# Patient Record
Sex: Female | Born: 1990 | Race: Black or African American | Hispanic: No | Marital: Single | Smoking: Former smoker
Health system: Southern US, Community
[De-identification: ages and names within clinical notes are randomized; demographics above are authoritative.]

## PROBLEM LIST (undated history)

## (undated) DIAGNOSIS — N926 Irregular menstruation, unspecified: Secondary | ICD-10-CM

## (undated) DIAGNOSIS — R7303 Prediabetes: Secondary | ICD-10-CM

## (undated) HISTORY — PX: NO PAST SURGERIES: SHX2092

## (undated) HISTORY — DX: Prediabetes: R73.03

## (undated) HISTORY — DX: Irregular menstruation, unspecified: N92.6

---
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
## 2009-03-23 ENCOUNTER — Emergency Department (HOSPITAL_COMMUNITY): Admission: EM | Admit: 2009-03-23 | Discharge: 2009-03-23 | Payer: Self-pay | Admitting: Emergency Medicine

## 2009-05-15 ENCOUNTER — Emergency Department (HOSPITAL_COMMUNITY): Admission: EM | Admit: 2009-05-15 | Discharge: 2009-05-15 | Payer: Self-pay | Admitting: Family Medicine

## 2009-07-16 ENCOUNTER — Emergency Department (HOSPITAL_COMMUNITY): Admission: EM | Admit: 2009-07-16 | Discharge: 2009-07-16 | Payer: Self-pay | Admitting: Emergency Medicine

## 2010-05-06 ENCOUNTER — Emergency Department (HOSPITAL_COMMUNITY): Admission: EM | Admit: 2010-05-06 | Discharge: 2010-05-06 | Payer: Self-pay | Admitting: Emergency Medicine

## 2010-05-24 ENCOUNTER — Emergency Department (HOSPITAL_COMMUNITY): Admission: EM | Admit: 2010-05-24 | Discharge: 2010-05-24 | Payer: Self-pay | Admitting: Emergency Medicine

## 2010-12-13 LAB — URINALYSIS, ROUTINE W REFLEX MICROSCOPIC
Nitrite: NEGATIVE
Protein, ur: NEGATIVE mg/dL
Urobilinogen, UA: 0.2 mg/dL (ref 0.0–1.0)

## 2010-12-13 LAB — POCT URINALYSIS DIPSTICK
Nitrite: NEGATIVE
Protein, ur: NEGATIVE mg/dL
Urobilinogen, UA: 0.2 mg/dL (ref 0.0–1.0)

## 2010-12-13 LAB — POCT PREGNANCY, URINE

## 2011-01-02 LAB — POCT URINALYSIS DIP (DEVICE)
Protein, ur: NEGATIVE mg/dL
Specific Gravity, Urine: 1.02 (ref 1.005–1.030)
Urobilinogen, UA: 0.2 mg/dL (ref 0.0–1.0)

## 2011-01-02 LAB — POCT PREGNANCY, URINE

## 2011-01-04 LAB — POCT URINALYSIS DIP (DEVICE)
Protein, ur: NEGATIVE mg/dL
Specific Gravity, Urine: 1.015 (ref 1.005–1.030)
Urobilinogen, UA: 0.2 mg/dL (ref 0.0–1.0)

## 2011-01-04 LAB — POCT PREGNANCY, URINE

## 2011-01-06 LAB — POCT URINALYSIS DIP (DEVICE)
Protein, ur: NEGATIVE mg/dL
Specific Gravity, Urine: 1.015 (ref 1.005–1.030)
Urobilinogen, UA: 0.2 mg/dL (ref 0.0–1.0)

## 2011-01-06 LAB — POCT I-STAT, CHEM 8
BUN: 5 mg/dL — ABNORMAL LOW (ref 6–23)
Calcium, Ion: 1.19 mmol/L (ref 1.12–1.32)
Creatinine, Ser: 0.8 mg/dL (ref 0.4–1.2)
Glucose, Bld: 124 mg/dL — ABNORMAL HIGH (ref 70–99)
TCO2: 29 mmol/L (ref 0–100)

## 2011-01-06 LAB — POCT PREGNANCY, URINE

## 2011-01-14 LAB — WET PREP, GENITAL: Clue Cells Wet Prep HPF POC: NONE SEEN

## 2011-01-14 LAB — POCT URINALYSIS DIP (DEVICE)
Protein, ur: 30 mg/dL — AB
Specific Gravity, Urine: 1.015 (ref 1.005–1.030)
Urobilinogen, UA: 0.2 mg/dL (ref 0.0–1.0)
pH: 8.5 — ABNORMAL HIGH (ref 5.0–8.0)

## 2011-04-02 ENCOUNTER — Emergency Department (HOSPITAL_COMMUNITY)
Admission: EM | Admit: 2011-04-02 | Discharge: 2011-04-02 | Disposition: A | Discharge status: Home / Self Care | Payer: Medicaid Other | Attending: Emergency Medicine | Admitting: Emergency Medicine

## 2011-04-02 DIAGNOSIS — B9689 Other specified bacterial agents as the cause of diseases classified elsewhere: Secondary | ICD-10-CM | POA: Insufficient documentation

## 2011-04-02 DIAGNOSIS — N72 Inflammatory disease of cervix uteri: Secondary | ICD-10-CM | POA: Insufficient documentation

## 2011-04-02 DIAGNOSIS — A499 Bacterial infection, unspecified: Secondary | ICD-10-CM | POA: Insufficient documentation

## 2011-04-02 DIAGNOSIS — N760 Acute vaginitis: Secondary | ICD-10-CM | POA: Insufficient documentation

## 2011-04-02 LAB — WET PREP, GENITAL
Trich, Wet Prep: NONE SEEN
Yeast Wet Prep HPF POC: NONE SEEN

## 2011-04-02 LAB — URINALYSIS, ROUTINE W REFLEX MICROSCOPIC
Glucose, UA: NEGATIVE mg/dL
Hgb urine dipstick: NEGATIVE
Ketones, ur: NEGATIVE mg/dL
Protein, ur: NEGATIVE mg/dL

## 2011-04-03 LAB — GC/CHLAMYDIA PROBE AMP, GENITAL: GC Probe Amp, Genital: NEGATIVE

## 2011-06-19 LAB — URINALYSIS, ROUTINE W REFLEX MICROSCOPIC
Glucose, UA: NEGATIVE
Nitrite: NEGATIVE
Protein, ur: NEGATIVE
pH: 6.5

## 2011-06-19 LAB — PREGNANCY, URINE

## 2011-06-23 LAB — WET PREP, GENITAL: Yeast Wet Prep HPF POC: NONE SEEN

## 2011-06-23 LAB — GC/CHLAMYDIA PROBE AMP, GENITAL
Chlamydia, DNA Probe: NEGATIVE
GC Probe Amp, Genital: NEGATIVE

## 2011-06-23 LAB — POCT URINALYSIS DIP (DEVICE)
Nitrite: NEGATIVE
Operator id: 247071
Protein, ur: 30 — AB

## 2011-09-18 ENCOUNTER — Encounter: Payer: Self-pay | Admitting: *Deleted

## 2011-09-18 ENCOUNTER — Emergency Department (INDEPENDENT_AMBULATORY_CARE_PROVIDER_SITE_OTHER)
Admission: EM | Admit: 2011-09-18 | Discharge: 2011-09-18 | Disposition: A | Discharge status: Home / Self Care | Payer: Medicaid Other | Source: Home / Self Care | Attending: Family Medicine | Admitting: Family Medicine

## 2011-09-18 DIAGNOSIS — T304 Corrosion of unspecified body region, unspecified degree: Secondary | ICD-10-CM

## 2011-09-18 DIAGNOSIS — T300 Burn of unspecified body region, unspecified degree: Secondary | ICD-10-CM

## 2011-09-18 DIAGNOSIS — L84 Corns and callosities: Secondary | ICD-10-CM

## 2011-09-18 DIAGNOSIS — IMO0002 Reserved for concepts with insufficient information to code with codable children: Secondary | ICD-10-CM

## 2011-09-18 MED ORDER — SILVER SULFADIAZINE 1 % EX CREA: TOPICAL_CREAM | Freq: Every day | CUTANEOUS | Status: DC

## 2011-09-18 NOTE — ED Notes (Signed)
Pt is here with multiple dermatologic complaints.  Has round wound to left underside of forearm.  Reports it started out looking like ringworm so she poured bleach on it.  Also reports "bump" in right axilla and abrasions to bilat bottoms of feet from manicure.

## 2011-09-18 NOTE — ED Provider Notes (Signed)
History     CSN: [id number]  Arrival date & time 09/18/11  0907   First MD Initiated Contact with Patient 09/18/11 1018      Chief Complaint  Patient presents with  . Wound Check    (Consider location/radiation/quality/duration/timing/severity/associated sxs/prior treatment) HPI Comments: 20 y/o obese female no significant PMH here with vague complaints:  1) Skin lesion: Has a tender dark brown spot in left fore arm over a tattooed area. Pt noticed a red itchi discoloration in the center a star tattoo and diagnosed herself with a ring worm proceeding to apply non disolved bleach in it, area now is dark brown and tender. Tatoos are not recent. 2)knot left underarm: has had a boil there in the past months ago, that spontaneously drained. Now has a "hard knot" no discharge also present for months. No breast lumps, tenderness or skin changes. 3) Foot soles are hard and tender. Has has sister trying to do a pedicure on her shoes soles are very thinned are falling apart, almost walking on her soaks, asked if we can help stapling her shoes. States she walks long distances to get to her home.   History reviewed. No pertinent past medical history.  History reviewed. No pertinent past surgical history.  History reviewed. No pertinent family history.  History  Substance Use Topics  . Smoking status: Never Smoker   . Smokeless tobacco: Not on file  . Alcohol Use: No    OB History    Grav Para Term Preterm Abortions TAB SAB Ect Mult Living                  Review of Systems  All other systems reviewed and are negative.    Allergies  Review of patient's allergies indicates no known allergies.  Home Medications   Current Outpatient Rx  Name Route Sig Dispense Refill  . SILVER SULFADIAZINE 1 % EX CREA Topical Apply topically daily. 50 g 0    BP 120/84  Pulse 70  Temp(Src) 98.3 F (36.8 C) (Oral)  Resp 18  SpO2 99%  LMP 08/24/2011  Physical Exam  Nursing note and  vitals reviewed. Constitutional: She is oriented to person, place, and time. She appears well-developed and well-nourished. No distress.       Talking on her cell phone sitting in exam table.  Neck: Neck supple.  Cardiovascular: Normal heart sounds.   Pulmonary/Chest: Breath sounds normal.  Lymphadenopathy:    She has no cervical adenopathy.  Neurological: She is alert and oriented to person, place, and time.  Skin:       Superficial, small epidermal cyst about 1x1 cm in left axilla, fibrotic consistency, no fluctuation, no attached to deep planes. No erythema, tenderness or drainage. No axillary adenopathies. Left forearm: about 2x3 cm flat brown dry abrasion mild base swelling no erythema around. No fluctuation, induration or drainage. Bilateral soles: Generalized thickness of the skin more over pressure areas in heel, lateral and metatarsal areas. Skin is callus like: thick, hypopigmented and peels. No skin brakes, ulcers or drainage.     ED Course  Procedures (including critical care time)  Labs Reviewed - No data to display No results found.   1. Chemical burn   2. Foot callus       MDM  Declined postop shoes. Needs to change shoes and get ones with soft padding, encouraged to loose weight and was referred to foot center. Hand out about foot callus and corn home care provided.  Sharin Grave, MD 09/19/11 1234

## 2011-10-03 ENCOUNTER — Ambulatory Visit: Payer: Medicaid Other | Admitting: *Deleted

## 2012-02-27 ENCOUNTER — Emergency Department (HOSPITAL_COMMUNITY)
Admission: EM | Admit: 2012-02-27 | Discharge: 2012-02-27 | Disposition: A | Discharge status: Home / Self Care | Payer: Medicaid Other | Source: Home / Self Care | Attending: Emergency Medicine | Admitting: Emergency Medicine

## 2012-02-27 ENCOUNTER — Encounter (HOSPITAL_COMMUNITY): Payer: Self-pay | Admitting: *Deleted

## 2012-02-27 DIAGNOSIS — N926 Irregular menstruation, unspecified: Secondary | ICD-10-CM

## 2012-02-27 LAB — POCT URINALYSIS DIP (DEVICE)
Ketones, ur: NEGATIVE mg/dL
Leukocytes, UA: NEGATIVE
Protein, ur: NEGATIVE mg/dL
Urobilinogen, UA: 0.2 mg/dL (ref 0.0–1.0)
pH: 6.5 (ref 5.0–8.0)

## 2012-02-27 NOTE — ED Provider Notes (Signed)
History     CSN: [id number]  Arrival date & time 02/27/12  0930   First MD Initiated Contact with Patient 02/27/12 0945      Chief Complaint  Patient presents with  . Fatigue    (Consider location/radiation/quality/duration/timing/severity/associated sxs/prior treatment) HPI Comments: Patient presents urgent care requesting to be tested for pregnancy and she's been feeling more tired recently she's been gaining weight. She describes her menstrual cycles as being very regular but was expecting her menstrual period this last week. Have not had her period yet. She has not tested herself at home.  The history is provided by the patient.    History reviewed. No pertinent past medical history.  History reviewed. No pertinent past surgical history.  Family History  Problem Relation Age of Onset  . Sickle cell anemia Sister     History  Substance Use Topics  . Smoking status: Never Smoker   . Smokeless tobacco: Not on file  . Alcohol Use: No    OB History    Grav Para Term Preterm Abortions TAB SAB Ect Mult Living                  Review of Systems  Constitutional: Positive for appetite change. Negative for fever, diaphoresis, activity change, fatigue and unexpected weight change.  Gastrointestinal: Negative for nausea and abdominal pain.  Genitourinary: Negative for urgency and pelvic pain.    Allergies  Review of patient's allergies indicates no known allergies.  Home Medications   Current Outpatient Rx  Name Route Sig Dispense Refill  . SILVER SULFADIAZINE 1 % EX CREA Topical Apply topically daily. 50 g 0    BP 128/82  Pulse 65  Temp(Src) 98.5 F (36.9 C) (Oral)  Resp 16  SpO2 96%  LMP 12/11/2011  Physical Exam  Nursing note and vitals reviewed. Constitutional: She is oriented to person, place, and time. Vital signs are normal. She appears well-developed and well-nourished.  Non-toxic appearance. She does not have a sickly appearance. She does not appear  ill. No distress.  HENT:  Head: Normocephalic.  Eyes: Conjunctivae are normal.  Abdominal: She exhibits no distension.  Neurological: She is alert and oriented to person, place, and time.  Psychiatric: She has a normal mood and affect.    ED Course  Procedures (including critical care time)   Labs Reviewed  POCT URINALYSIS DIP (DEVICE)  POCT PREGNANCY, URINE   No results found.   1. Irregular menstruation    negative pregnancy test    MDM  Patient reporting irregular periods for years, she has a gynecologist at [facility] that she has not called or have been seen for this reason. Pregnancy test was negative. Encourage patient to call her gynecologist as to further discuss her irregular menstruations .     Jimmie Molly, MD 02/27/12 1341

## 2012-02-27 NOTE — ED Notes (Signed)
Pt is asking for a pregnancy test.   She also gained about 20-30 lbs in the past 3-4 months and c/o frequent fatigue

## 2012-02-27 NOTE — Discharge Instructions (Signed)
As discussed followup with your gynecologist to discuss this irregular periods further. Will apply 2 to repeat your pregnancy test in 7-10 days from now. With an over-the-counter product   Abnormal Uterine Bleeding Abnormal uterine bleeding can have many causes. Some cases are simply treated, while others are more serious. There are several kinds of bleeding that is considered abnormal, including:  Bleeding between periods.   Bleeding after sexual intercourse.   Spotting anytime in the menstrual cycle.   Bleeding heavier or more than normal.   Bleeding after menopause.  CAUSES  There are many causes of abnormal uterine bleeding. It can be present in teenagers, pregnant women, women during their reproductive years, and women who have reached menopause. Your caregiver will look for the more common causes depending on your age, signs, symptoms and your particular circumstance. Most cases are not serious and can be treated. Even the more serious causes, like cancer of the female organs, can be treated adequately if found in the early stages. That is why all types of bleeding should be evaluated and treated as soon as possible. DIAGNOSIS  Diagnosing the cause may take several kinds of tests. Your caregiver may:  Take a complete history of the type of bleeding.   Perform a complete physical exam and Pap smear.   Take an ultrasound on the abdomen showing a picture of the female organs and the pelvis.   Inject dye into the uterus and Fallopian tubes and X-ray them (hysterosalpingogram).   Place fluid in the uterus and do an ultrasound (sonohysterogrqphy).   Take a CT scan to examine the female organs and pelvis.   Take an MRI to examine the female organs and pelvis. There is no X-ray involved with this procedure.   Look inside the uterus with a telescope that has a light at the end (hysteroscopy).   Scrap the inside of the uterus to get tissue to examine (Dilatation and Curettage, D&C).    Look into the pelvis with a telescope that has a light at the end (laparoscopy). This is done through a very small cut (incision) in the abdomen.  TREATMENT  Treatment will depend on the cause of the abnormal bleeding. It can include:  Doing nothing to allow the problem to take care of itself over time.   Hormone treatment.   Birth control pills.   Treating the medical condition causing the problem.   Laparoscopy.   Major or minor surgery   Destroying the lining of the uterus with electrical currant, laser, freezing or heat (uterine ablation).  HOME CARE INSTRUCTIONS   Follow your caregiver's recommendation on how to treat your problem.   See your caregiver if you missed a menstrual period and think you may be pregnant.   If you are bleeding heavily, count the number of pads/tampons you use and how often you have to change them. Tell this to your caregiver.   Avoid sexual intercourse until the problem is controlled.  SEEK MEDICAL CARE IF:   You have any kind of abnormal bleeding mentioned above.   You feel dizzy at times.   You are 21 years old and have not had a menstrual period yet.  SEEK IMMEDIATE MEDICAL CARE IF:   You pass out.   You are changing pads/tampons every 15 to 30 minutes.   You have belly (abdominal) pain.   You have a temperature of 100 F (37.8 C) or higher.   You become sweaty or weak.   You are passing large  blood clots from the vagina.   You start to feel sick to your stomach (nauseous) and throw up (vomit).  Document Released: 09/15/2005 Document Revised: 09/04/2011 Document Reviewed: 02/08/2009 [facility] Patient Information 2012 [place], [place].

## 2012-08-15 ENCOUNTER — Encounter (HOSPITAL_COMMUNITY): Payer: Self-pay | Admitting: *Deleted

## 2012-08-15 ENCOUNTER — Emergency Department (HOSPITAL_COMMUNITY): Payer: Medicaid Other

## 2012-08-15 ENCOUNTER — Emergency Department (HOSPITAL_COMMUNITY)
Admission: EM | Admit: 2012-08-15 | Discharge: 2012-08-15 | Disposition: A | Discharge status: Home / Self Care | Payer: Medicaid Other | Attending: Emergency Medicine | Admitting: Emergency Medicine

## 2012-08-15 DIAGNOSIS — I10 Essential (primary) hypertension: Secondary | ICD-10-CM | POA: Insufficient documentation

## 2012-08-15 DIAGNOSIS — R109 Unspecified abdominal pain: Secondary | ICD-10-CM | POA: Insufficient documentation

## 2012-08-15 DIAGNOSIS — M778 Other enthesopathies, not elsewhere classified: Secondary | ICD-10-CM

## 2012-08-15 DIAGNOSIS — M65839 Other synovitis and tenosynovitis, unspecified forearm: Secondary | ICD-10-CM | POA: Insufficient documentation

## 2012-08-15 LAB — CBC WITH DIFFERENTIAL/PLATELET
Basophils Absolute: 0 K/uL (ref 0.0–0.1)
Basophils Relative: 0 % (ref 0–1)
Eosinophils Absolute: 0.3 K/uL (ref 0.0–0.7)
Eosinophils Relative: 2 % (ref 0–5)
HCT: 42.2 % (ref 36.0–46.0)
Hemoglobin: 14.1 g/dL (ref 12.0–15.0)
Lymphocytes Relative: 32 % (ref 12–46)
Lymphs Abs: 4.3 10*3/uL — ABNORMAL HIGH (ref 0.7–4.0)
MCH: 26.4 pg (ref 26.0–34.0)
MCHC: 33.4 g/dL (ref 30.0–36.0)
MCV: 79 fL (ref 78.0–100.0)
Monocytes Absolute: 0.7 10*3/uL (ref 0.1–1.0)
Monocytes Relative: 5 % (ref 3–12)
Neutro Abs: 8.1 K/uL — ABNORMAL HIGH (ref 1.7–7.7)
Neutrophils Relative %: 60 % (ref 43–77)
Platelets: 233 K/uL (ref 150–400)
RBC: 5.34 MIL/uL — ABNORMAL HIGH (ref 3.87–5.11)
RDW: 14 % (ref 11.5–15.5)
WBC: 13.5 10*3/uL — ABNORMAL HIGH (ref 4.0–10.5)

## 2012-08-15 LAB — URINALYSIS, MICROSCOPIC ONLY
Bilirubin Urine: NEGATIVE
Glucose, UA: NEGATIVE mg/dL
Hgb urine dipstick: NEGATIVE
Ketones, ur: NEGATIVE mg/dL
Leukocytes, UA: NEGATIVE
Nitrite: NEGATIVE
Protein, ur: NEGATIVE mg/dL
Specific Gravity, Urine: 1.025 (ref 1.005–1.030)
Urobilinogen, UA: 0.2 mg/dL (ref 0.0–1.0)
pH: 6.5 (ref 5.0–8.0)

## 2012-08-15 LAB — COMPREHENSIVE METABOLIC PANEL WITH GFR
AST: 20 U/L (ref 0–37)
Albumin: 4.1 g/dL (ref 3.5–5.2)
Alkaline Phosphatase: 101 U/L (ref 39–117)
Chloride: 98 meq/L (ref 96–112)
Potassium: 4.2 meq/L (ref 3.5–5.1)
Sodium: 138 meq/L (ref 135–145)
Total Bilirubin: 0.1 mg/dL — ABNORMAL LOW (ref 0.3–1.2)
Total Protein: 8.1 g/dL (ref 6.0–8.3)

## 2012-08-15 LAB — WET PREP, GENITAL: Clue Cells Wet Prep HPF POC: NONE SEEN

## 2012-08-15 LAB — COMPREHENSIVE METABOLIC PANEL
ALT: 16 U/L (ref 0–35)
BUN: 11 mg/dL (ref 6–23)
CO2: 29 mEq/L (ref 19–32)
Calcium: 10 mg/dL (ref 8.4–10.5)
Creatinine, Ser: 0.69 mg/dL (ref 0.50–1.10)
GFR calc Af Amer: >90 mL/min (ref >90)
GFR calc non Af Amer: >90 mL/min (ref >90)
Glucose, Bld: 110 mg/dL — ABNORMAL HIGH (ref 70–99)

## 2012-08-15 LAB — POCT PREGNANCY, URINE: Preg Test, Ur: NEGATIVE

## 2012-08-15 LAB — LIPASE, BLOOD: Lipase: 23 U/L (ref 11–59)

## 2012-08-15 MED ORDER — DICYCLOMINE HCL 20 MG PO TABS: 20.0000 mg | ORAL_TABLET | Freq: Two times a day (BID) | ORAL | Status: DC

## 2012-08-15 NOTE — ED Provider Notes (Signed)
History     CSN: [id number]  Arrival date & time 08/15/12  1253   First MD Initiated Contact with Patient 08/15/12 1522      Chief Complaint  Patient presents with  . Abdominal Pain  . Hypertension  . Arm Pain    (Consider location/radiation/quality/duration/timing/severity/associated sxs/prior treatment) HPI Sherry Duke is a 21 y.o. female who presents with complaint of abdominal pain, and left forearm pain.  Abdominal pain:  Pt states pain started about 3 years ago. States on and off but most recently constant. Pain is in lower abdomen. [facility] had [facility], [place], pelvic exam and was told it was "weak stomach." Pt states she is unsure what that means, and pain not improving. Pt denies fever, chills, changes in appetite, malaise, urinary symptoms, vaginal discharge or bleeding. Pt not taking any medications for this. Bowels are normal. Pt does not remember her last period, states she is very irregular, and only gets about 3 periods a year.  Left forearm pain: Pt states he woke up with pain in left wrist pain this morning. Denies injury. States pain with movement of the wrist. No redness or swelling. States no hx of the same. No pain in hand or elbow.  History reviewed. No pertinent past medical history.  History reviewed. No pertinent past surgical history.  Family History  Problem Relation Age of Onset  . Sickle cell anemia Sister     History  Substance Use Topics  . Smoking status: Never Smoker   . Smokeless tobacco: Not on file  . Alcohol Use: No    OB History    Grav Para Term Preterm Abortions TAB SAB Ect Mult Living                  Review of Systems  Gastrointestinal: Positive for abdominal pain. Negative for nausea and diarrhea.  Genitourinary: Positive for pelvic pain. Negative for dysuria, vaginal bleeding and vaginal discharge.  Musculoskeletal: Positive for myalgias.  Neurological: Negative for  weakness and numbness.  All other systems reviewed and are negative.    Allergies  Review of patient's allergies indicates no known allergies.  Home Medications  No current outpatient prescriptions on file.  BP 143/88  Pulse 79  Temp 98 F (36.7 C) (Oral)  Resp 18  SpO2 97%  LMP 02/13/2012  Physical Exam  Nursing note and vitals reviewed. Constitutional: She appears well-developed and well-nourished. No distress.       obese  HENT:  Head: Normocephalic.  Eyes: Conjunctivae normal are normal.  Neck: Neck supple.  Cardiovascular: Normal rate, regular rhythm and normal heart sounds.   Pulmonary/Chest: Effort normal and breath sounds normal.  Abdominal: Soft. Bowel sounds are normal. She exhibits no distension. There is tenderness. There is no rebound and no guarding.       Suprapubic tenderness  Genitourinary: Vagina normal and uterus normal. No vaginal discharge found.       No cmt  Musculoskeletal:       Left wrist normal appearing. Tender to palpation over entire left wrist and distal forearm. Pain with left wrist ROM in any directions. Normal hand exam. Normal elbow. There is no swelling of hand or wrist. Normal distal radial pulse. No redness or warmth to the touch with palpation of the tender area  Neurological: She is alert.  Skin: Skin is warm and dry.  Psychiatric: She has a normal mood and affect.    ED Course  Procedures (including critical care time)  PT with lower abdominal pain for several years now, worsening. Left forearm pain, labs, x-ray pending.   Results for orders placed during the hospital encounter of 08/15/12  URINALYSIS, MICROSCOPIC ONLY      Component Value Range   Color, Urine YELLOW  YELLOW   APPearance CLEAR  CLEAR   Specific Gravity, Urine 1.025  1.005 - 1.030   pH 6.5  5.0 - 8.0   Glucose, UA NEGATIVE  NEGATIVE mg/dL   Hgb urine dipstick NEGATIVE  NEGATIVE   Bilirubin Urine NEGATIVE  NEGATIVE   Ketones, ur NEGATIVE  NEGATIVE mg/dL    Protein, ur NEGATIVE  NEGATIVE mg/dL   Urobilinogen, UA 0.2  0.0 - 1.0 mg/dL   Nitrite NEGATIVE  NEGATIVE   Leukocytes, UA NEGATIVE  NEGATIVE   Squamous Epithelial / LPF RARE  RARE  CBC WITH DIFFERENTIAL      Component Value Range   WBC 13.5 (*) 4.0 - 10.5 K/uL   RBC 5.34 (*) 3.87 - 5.11 MIL/uL   Hemoglobin 14.1  12.0 - 15.0 g/dL   HCT 16.1  09.6 - 04.5 %   MCV 79.0  78.0 - 100.0 fL   MCH 26.4  26.0 - 34.0 pg   MCHC 33.4  30.0 - 36.0 g/dL   RDW 40.9  81.1 - 91.4 %   Platelets 233  150 - 400 K/uL   Neutrophils Relative 60  43 - 77 %   Neutro Abs 8.1 (*) 1.7 - 7.7 K/uL   Lymphocytes Relative 32  12 - 46 %   Lymphs Abs 4.3 (*) 0.7 - 4.0 K/uL   Monocytes Relative 5  3 - 12 %   Monocytes Absolute 0.7  0.1 - 1.0 K/uL   Eosinophils Relative 2  0 - 5 %   Eosinophils Absolute 0.3  0.0 - 0.7 K/uL   Basophils Relative 0  0 - 1 %   Basophils Absolute 0.0  0.0 - 0.1 K/uL  COMPREHENSIVE METABOLIC PANEL      Component Value Range   Sodium 138  135 - 145 mEq/L   Potassium 4.2  3.5 - 5.1 mEq/L   Chloride 98  96 - 112 mEq/L   CO2 29  19 - 32 mEq/L   Glucose, Bld 110 (*) 70 - 99 mg/dL   BUN 11  6 - 23 mg/dL   Creatinine, Ser 7.82  0.50 - 1.10 mg/dL   Calcium 95.6  8.4 - 21.3 mg/dL   Total Protein 8.1  6.0 - 8.3 g/dL   Albumin 4.1  3.5 - 5.2 g/dL   AST 20  0 - 37 U/L   ALT 16  0 - 35 U/L   Alkaline Phosphatase 101  39 - 117 U/L   Total Bilirubin 0.1 (*) 0.3 - 1.2 mg/dL   GFR calc non Af Amer >90  >90 mL/min   GFR calc Af Amer >90  >90 mL/min  LIPASE, BLOOD      Component Value Range   Lipase 23  11 - 59 U/L  POCT PREGNANCY, URINE      Component Value Range   Preg Test, Ur NEGATIVE  NEGATIVE  WET PREP, GENITAL      Component Value Range   Yeast Wet Prep HPF POC NONE SEEN  NONE SEEN   Trich, Wet Prep NONE SEEN  NONE SEEN   Clue Cells Wet Prep HPF POC NONE SEEN  NONE SEEN   WBC, Wet Prep HPF POC  RARE (*) NONE SEEN   Dg Forearm Left  08/15/2012  *RADIOLOGY REPORT*  Clinical Data:  Left forearm pain.  No injury.  LEFT FOREARM - 2 VIEW  Comparison: None.  Findings: Left radius and ulna appear normal.  Soft tissues are normal.  No radiopaque foreign body.  IMPRESSION: Negative.   Original Report Authenticated By: Andreas Newport, M.D.       1. Abdominal pain   2. Tendonitis of wrist, left       MDM  Pt with lower abdominal pain for 3 years. She is non toxic appearing. ua and pelvic exam unremarkable. [facility] that [facility]. She has no n/v/d. No fever, suspect IBS? Pt instructed to follow up with pcp. Will try bentyl. Left forearm x-ray negative. Suspect tendonitis. Splint. Follow up. No signs of infection the forearm.         Lottie Mussel, PA 08/16/12 0009

## 2012-08-15 NOTE — ED Notes (Signed)
Back from xray

## 2012-08-15 NOTE — Progress Notes (Signed)
Orthopedic Tech Progress Note Patient Details:  Sherry Duke October 03, 1990 [id number]  Ortho Devices Type of Ortho Device: Velcro wrist splint Ortho Device/Splint Location: left UE Ortho Device/Splint Interventions: Application   Yuvan Medinger T 08/15/2012, 6:49 PM

## 2012-08-15 NOTE — ED Notes (Signed)
Patient discharged using teach back method she verbalizes an understanding.Left arm splinted per ortho, NAD noted at the time of discharge.

## 2012-08-15 NOTE — ED Notes (Signed)
Patient complains of severe arm pain she denies injury.

## 2012-08-15 NOTE — ED Notes (Signed)
Pt has multiple complaints. Reports chronic abd pain, now radiates down into her pelvic area. Having chronic left arm pain and also reports HTN. bp 143/88 at triage.

## 2012-08-15 NOTE — ED Notes (Signed)
Patient transported to X-ray 

## 2012-08-15 NOTE — ED Notes (Signed)
Placed in a gown for pelvic exam

## 2012-08-16 LAB — GC/CHLAMYDIA PROBE AMP: CT Probe RNA: NEGATIVE

## 2012-08-17 NOTE — ED Provider Notes (Signed)
Medical screening examination/treatment/procedure(s) were performed by non-physician practitioner and as supervising physician I was immediately available for consultation/collaboration.   Kelsee Preslar Y. Maddock Finigan, MD 08/17/12 2128 

## 2012-08-31 ENCOUNTER — Emergency Department (INDEPENDENT_AMBULATORY_CARE_PROVIDER_SITE_OTHER)
Admission: EM | Admit: 2012-08-31 | Discharge: 2012-08-31 | Disposition: A | Discharge status: Home / Self Care | Payer: Medicaid Other | Source: Home / Self Care

## 2012-08-31 ENCOUNTER — Encounter (HOSPITAL_COMMUNITY): Payer: Self-pay | Admitting: Emergency Medicine

## 2012-08-31 DIAGNOSIS — L84 Corns and callosities: Secondary | ICD-10-CM

## 2012-08-31 DIAGNOSIS — R109 Unspecified abdominal pain: Secondary | ICD-10-CM

## 2012-08-31 DIAGNOSIS — G8929 Other chronic pain: Secondary | ICD-10-CM

## 2012-08-31 MED ORDER — DICYCLOMINE HCL 20 MG PO TABS: 20.0000 mg | ORAL_TABLET | Freq: Two times a day (BID) | ORAL | Status: DC

## 2012-08-31 NOTE — ED Notes (Addendum)
Reports pelvic pain and foot pain

## 2012-08-31 NOTE — ED Provider Notes (Signed)
History     CSN: [id number]  Arrival date & time 08/31/12  1559      Chief Complaint  Patient presents with  . Pelvic Pain  . Foot Pain     HPI 21 year old female here for medication refill of dicyclomine. She has chronic pain in her lower abdomen and pelvis ongoing for past 3-4 years. She has had evaluation by GYN in the past including ultrasound of her upper abdomen 2 months ago which she claims to have been normal. She informs of having off and on crampy abdominal pain associated with some loose bowel movements. Informs that the symptoms occur a few times a week as well as wakes her up from sleep on few occasions with the urge to defecate. She denies any nausea or vomiting. Her menstrual cycle is irregular. She denies any change in her weight or appetite. Denies any depressive symptoms. She denies any fever, chills, headache, blurry vision, hoarseness of voice, chest pain, palpitations, shortness of breath, urinary symptoms or constipation. She denies any muscle aches or joint pain.  History reviewed. No pertinent past medical history.  History reviewed. No pertinent past surgical history.  Family History  Problem Relation Age of Onset  . Sickle cell anemia Sister     History  Substance Use Topics  . Smoking status: , smokes marijuana on a daily basis   . Smokeless tobacco: Not on file  . Alcohol Use: No    OB History    Grav Para Term Preterm Abortions TAB SAB Ect Mult Living                  Review of Systems As outlined in history of present illness Denies headache, blurry vision, fever, chills, hoarseness of voice Denies cough, shortness of breath, chest pain, palpitations, dyspnea on exertion Lower Abdominal pain which is chronic. Denies dysuria urgency or frequency. Abdominal pain is associated with loose bowel movements. Denies joint pain or myalgia. Has chronic pain over bilateral foot with callus. Denies depressive symptoms. Denies changes in mood, changes in  weight or appetite.  Allergies  Review of patient's allergies indicates no known allergies.  Home Medications   Current Outpatient Rx  Name  Route  Sig  Dispense  Refill  . DICYCLOMINE HCL 20 MG PO TABS   Oral   Take 1 tablet (20 mg total) by mouth 2 (two) times daily.   30 tablet   0     BP 126/85  Pulse 82  Temp 98.1 F (36.7 C) (Oral)  Resp 20  SpO2 99%  LMP 02/13/2012  Physical Exam  Young obese female in no acute distress HEENT: No pallor, no icterus, moist oral mucosa Chest: Clear to auscultation bilaterally spine CVS: Normal S1 and S2 no murmurs Abdomen: Soft, bowel sounds present nontender, nondistended Extremities warm, callus in bilateral feet which is tender to exam CNS: AAO x3 ED Course  Procedures (including critical care time)  Labs Reviewed - No data to display No results found.  Assessment /plan 1. Chronic abdominal pain   2. Callus of foot    Chronic abdominal pain Patient has chronic abdominal pain and pelvic pain that has been ongoing for past 3-4 years. She informs having had a GYN workup including pelvic ultrasound which was unremarkable. She does have menstrual irregularities. She denies having constipation however had episodes of loose bowel movements. She denies any change in her weight or appetite. Denies any mood changes. She however does inform of having to get up  in the night off and on with urge for defecation. Patient was recently seen in the ED and prescribed dicyclomine which has help with her symptoms to an extent. -I. will renew her prescription off dicyclomine. She needs a GI evaluation for ongoing symptoms and I will make a referral for that.  Callus off bilateral feet These have been ongoing for few years already. She was advised on postop shoes which she declined in the past. He has been advised on wearing soft cushioned shoes. I will make a referral to podiatry for evaluation.   ? Prediabetes Patient informs being diagnosed  of having prediabetes and elevated cholesterol. I will check an A1c level and lipid panel. She will followup with [place] in 3 months. I have counseled her on diet controlled and regular exercise to reduce weight. Blood Work done recently in the ED was unremarkable .   Health maintenance Patient informs to followup with GYN for Pap smear.  MDM          Eddie North, MD 08/31/12 [phone]

## 2012-09-01 LAB — HEMOGLOBIN A1C: Hgb A1c MFr Bld: 6.4 % — ABNORMAL HIGH (ref <5.7)

## 2012-09-01 LAB — LIPID PANEL
LDL Cholesterol: 131 mg/dL — ABNORMAL HIGH (ref 0–99)
VLDL: 25 mg/dL (ref 0–40)

## 2012-09-01 LAB — TSH: TSH: 1.899 u[IU]/mL (ref 0.350–4.500)

## 2012-10-14 NOTE — ED Notes (Signed)
Referral to podiatry- [facility] patient had an appt 09/10/12 @ 9:30 am did not show- referral to [facility] for abd pain 09/20/12 @ 3pm pt did not show to her appt

## 2012-11-12 ENCOUNTER — Emergency Department (INDEPENDENT_AMBULATORY_CARE_PROVIDER_SITE_OTHER)
Admission: EM | Admit: 2012-11-12 | Discharge: 2012-11-12 | Disposition: A | Discharge status: Home / Self Care | Payer: Medicaid Other | Source: Home / Self Care

## 2012-11-12 ENCOUNTER — Encounter (HOSPITAL_COMMUNITY): Payer: Self-pay

## 2012-11-12 DIAGNOSIS — Z7251 High risk heterosexual behavior: Secondary | ICD-10-CM

## 2012-11-12 DIAGNOSIS — R7309 Other abnormal glucose: Secondary | ICD-10-CM

## 2012-11-12 DIAGNOSIS — R7303 Prediabetes: Secondary | ICD-10-CM

## 2012-11-12 LAB — POCT PREGNANCY, URINE: Preg Test, Ur: NEGATIVE

## 2012-11-12 LAB — HIV ANTIBODY (ROUTINE TESTING W REFLEX): HIV: NONREACTIVE

## 2012-11-12 MED ORDER — METFORMIN HCL 500 MG PO TABS: ORAL_TABLET | ORAL | Status: DC

## 2012-11-12 NOTE — ED Provider Notes (Signed)
History     CSN: [id number]  Arrival date & time 11/12/12  1231   First MD Initiated Contact with Patient 11/12/12 1247      Chief Complaint  Patient presents with  . Follow-up    (Consider location/radiation/quality/duration/timing/severity/associated sxs/prior treatment) HPI Patient is here today for a routine checkup. She tells me that she has a new boyfriend and she has been having unprotected sex with him. She wants herself to be checked out for HIV. Patient also wants a urine pregnancy test done today. No other complaints at this time.  History reviewed. No pertinent past medical history.  History reviewed. No pertinent past surgical history.  Family History  Problem Relation Age of Onset  . Sickle cell anemia Sister     History  Substance Use Topics  . Smoking status: Never Smoker   . Smokeless tobacco: Not on file  . Alcohol Use: No    OB History   Grav Para Term Preterm Abortions TAB SAB Ect Mult Living                  Review of Systems  Allergies  Review of patient's allergies indicates no known allergies.  Home Medications   Current Outpatient Rx  Name  Route  Sig  Dispense  Refill  . dicyclomine (BENTYL) 20 MG tablet   Oral   Take 1 tablet (20 mg total) by mouth 2 (two) times daily.   30 tablet   0   . metFORMIN (GLUCOPHAGE) 500 MG tablet      Please take 1 tab with supper for 1 week and then 1 tab twice daily with meals.   60 tablet   2     BP 123/75  Pulse 64  Temp(Src) 98.4 F (36.9 C) (Oral)  SpO2 95%  Physical Exam Physical Exam: General: Vital signs reviewed and noted. Well-developed, well-nourished, in no acute distress; alert, appropriate and cooperative throughout examination.  Head: Normocephalic, atraumatic.  Eyes: PERRL, EOMI, No signs of anemia or jaundince.  Nose: Mucous membranes moist, not inflammed, nonerythematous.  Throat: Oropharynx nonerythematous, no exudate appreciated.   Neck: No deformities, masses,  or tenderness noted.Supple, No carotid Bruits, no JVD.  Lungs:  Normal respiratory effort. Clear to auscultation BL without crackles or wheezes.  Heart: RRR. S1 and S2 normal without gallop, murmur, or rubs.  Abdomen:  BS normoactive. Soft, Nondistended, non-tender.  No masses or organomegaly.  Extremities: No pretibial edema.  Neurologic: A&O X3, CN II - XII are grossly intact. Motor strength is 5/5 in the all 4 extremities, Sensations intact to light touch, Cerebellar signs negative.  Skin: No visible rashes, scars.    ED Course  Procedures (including critical care time)  Labs Reviewed  HIV ANTIBODY (ROUTINE TESTING)   No results found.   1. Prediabetes   2. High risk sexual behavior       MDM  1. patient's urine pregnancy test was done and is negative at this time. 2. her HIV test was withdrawn and she will be informed about the results. 3. no other blood tests need to be done for normal physical at this time. 4. the patient was counseled for high risk sexual behavior and was told to use protection to be safe from sexually transmitted diseases. 5. patient is currently prediabetic based on her HbA1c of 6.4. I have started her on metformin 500 mg twice a day. Patient needs to followup in 3 months. Patient was also advised to lose weight which should  help with her prediabetes.      Lars Mage, MD 11/12/12 1319

## 2012-11-12 NOTE — ED Notes (Signed)
Patient states would like a check up Would like bloodwork as well

## 2013-02-11 ENCOUNTER — Emergency Department (HOSPITAL_COMMUNITY): Payer: Medicaid Other

## 2013-02-11 ENCOUNTER — Emergency Department (HOSPITAL_COMMUNITY)
Admission: EM | Admit: 2013-02-11 | Discharge: 2013-02-11 | Disposition: A | Discharge status: Home / Self Care | Payer: Medicaid Other | Attending: Emergency Medicine | Admitting: Emergency Medicine

## 2013-02-11 ENCOUNTER — Encounter (HOSPITAL_COMMUNITY): Payer: Self-pay | Admitting: Cardiology

## 2013-02-11 DIAGNOSIS — N898 Other specified noninflammatory disorders of vagina: Secondary | ICD-10-CM | POA: Insufficient documentation

## 2013-02-11 DIAGNOSIS — M79609 Pain in unspecified limb: Secondary | ICD-10-CM | POA: Insufficient documentation

## 2013-02-11 DIAGNOSIS — E669 Obesity, unspecified: Secondary | ICD-10-CM | POA: Insufficient documentation

## 2013-02-11 DIAGNOSIS — M79622 Pain in left upper arm: Secondary | ICD-10-CM

## 2013-02-11 DIAGNOSIS — R7309 Other abnormal glucose: Secondary | ICD-10-CM | POA: Insufficient documentation

## 2013-02-11 DIAGNOSIS — Z79899 Other long term (current) drug therapy: Secondary | ICD-10-CM | POA: Insufficient documentation

## 2013-02-11 DIAGNOSIS — R0789 Other chest pain: Secondary | ICD-10-CM | POA: Insufficient documentation

## 2013-02-11 DIAGNOSIS — Z3202 Encounter for pregnancy test, result negative: Secondary | ICD-10-CM | POA: Insufficient documentation

## 2013-02-11 DIAGNOSIS — N939 Abnormal uterine and vaginal bleeding, unspecified: Secondary | ICD-10-CM

## 2013-02-11 DIAGNOSIS — Z8742 Personal history of other diseases of the female genital tract: Secondary | ICD-10-CM | POA: Insufficient documentation

## 2013-02-11 LAB — URINALYSIS, ROUTINE W REFLEX MICROSCOPIC
Bilirubin Urine: NEGATIVE
Ketones, ur: NEGATIVE mg/dL
Nitrite: NEGATIVE
Protein, ur: NEGATIVE mg/dL
Urobilinogen, UA: 1 mg/dL (ref 0.0–1.0)
pH: 6 (ref 5.0–8.0)

## 2013-02-11 LAB — WET PREP, GENITAL: Yeast Wet Prep HPF POC: NONE SEEN

## 2013-02-11 LAB — POCT I-STAT, CHEM 8
Calcium, Ion: 1.23 mmol/L (ref 1.12–1.23)
Chloride: 104 mEq/L (ref 96–112)
HCT: 41 % (ref 36.0–46.0)
Sodium: 142 mEq/L (ref 135–145)

## 2013-02-11 LAB — POCT I-STAT TROPONIN I: Troponin i, poc: 0 ng/mL (ref 0.00–0.08)

## 2013-02-11 LAB — POCT PREGNANCY, URINE: Preg Test, Ur: NEGATIVE

## 2013-02-11 NOTE — ED Notes (Signed)
Provider at the bedside.  

## 2013-02-11 NOTE — ED Notes (Signed)
Pt shown to the bathroom and asked for a urine sample

## 2013-02-11 NOTE — ED Provider Notes (Signed)
History     CSN: [id number]  Arrival date & time 02/11/13  4098   First MD Initiated Contact with Patient 02/11/13 0802      No chief complaint on file.   (Consider location/radiation/quality/duration/timing/severity/associated sxs/prior treatment) HPI  Sherry Duke is a 22 year old female presents emergency department with complaint of vaginal bleeding and chest pain. She is obese and borderline diabetic with Hgb A1C of 6.4 in February of this year. Patient C. She has a history of irregular menstruation and generally does not get her period, however she has been bleeding for the past 4 weeks.  She states that there is an occasional resolution of her bleeding for a day or so and then she will begin to bleed from her vagina again.  She states that she is going through approximately 3 pads a day with her bleeding.  Patient has frequency of urination without dysuria, hematuria, or urgency.  She denies discharge or foul odor from her vagina.  She denies pain with sexual intercourse.  Patient is sexually active, she does not use any form of birth control. Patient denies any abdominal pain, constipation, diarrhea.  She has mild daily nausea but denies any vomiting. Patient also complains of chest pain.  She states that this has been going on for approximately 2 weeks.  She states that the pain is under her left breast.  She describes it as squeezing and painful.  It occurs intermittently and is not associated with movement or exertion.  She states that it lasts anywhere from 1-10 seconds.  She denies any chest pressure or, diaphoresis, shortness of breath.  Patient has a family history of heart disease but is unsure if anyone in her family has had a heart attack.  She does have a strong family history of obesity and diabetes.  She is nonsmoker.  She denies any unilateral leg swelling or pain.  She denies any hemoptysis or dyspnea.  No recent surgeries or confinement no exogenous estrogens.     No  past medical history on file.  No past surgical history on file.  Family History  Problem Relation Age of Onset  . Sickle cell anemia Sister     History  Substance Use Topics  . Smoking status: Never Smoker   . Smokeless tobacco: Not on file  . Alcohol Use: No    OB History   Grav Para Term Preterm Abortions TAB SAB Ect Mult Living                  Review of Systems Ten systems reviewed and are negative for acute change, except as noted in the HPI.   Allergies  Review of patient's allergies indicates no known allergies.  Home Medications   Current Outpatient Rx  Name  Route  Sig  Dispense  Refill  . dicyclomine (BENTYL) 20 MG tablet   Oral   Take 1 tablet (20 mg total) by mouth 2 (two) times daily.   30 tablet   0   . metFORMIN (GLUCOPHAGE) 500 MG tablet      Please take 1 tab with supper for 1 week and then 1 tab twice daily with meals.   60 tablet   2     There were no vitals taken for this visit.  Physical Exam  Nursing note and vitals reviewed. Constitutional: She is oriented to person, place, and time. She appears well-developed and well-nourished. No distress.  Obese  HENT:  Head: Normocephalic and atraumatic.  Eyes: Conjunctivae are  normal. No scleral icterus.  Neck: Normal range of motion.  Cardiovascular: Normal rate, regular rhythm and normal heart sounds.  Exam reveals no gallop and no friction rub.   No murmur heard. Pulmonary/Chest: Effort normal and breath sounds normal. No respiratory distress.  Abdominal: Soft. Bowel sounds are normal. She exhibits no distension and no mass. There is no tenderness. There is no guarding.  Neurological: She is alert and oriented to person, place, and time.  Skin: Skin is warm and dry. She is not diaphoretic.    ED Course  Procedures (including critical care time)  Labs Reviewed - No data to display No results found.   Date: 02/11/2013  Rate: 79  Rhythm: normal sinus rhythm  QRS Axis: normal   Intervals: normal  ST/T Wave abnormalities: nonspecific T wave changes  Conduction Disutrbances:none  Narrative Interpretation:   Old EKG Reviewed: none available    1. Vaginal bleeding   2. Axillary pain, left   3. Atypical chest pain       MDM  9:04 AM Patient with out chest pain or bleeding at this time.  I suspect the patients, urinary frequency may be to her blood sugars.  Patient I will evaluate the patient's hgb level, urine, urine preg,     9:57 AM BP 147/86  Pulse 68  Temp(Src) 97.8 F (36.6 C) (Oral)  Resp 18  SpO2 98%  LMP 10/01/2012 Patient c/o paiful lumps under her left armpit. States that they have been there for a long time but have recently become painful. On exam, she has painful, firm superficial lumps in the crease of her axilla. On my exam with [place] I did not see drainable any dark fluid filled pockets or drainable abscess. Likely sebaceous cyst, may be developing into infection.  pateint al;so states that she has not been taking her metformin as prescribed due to the facet that medicaid does not cover the entire cost. She also states that she "does not care" anymore, but denies depression, SI/AVS/HI.  Pelvic exam: normal external genitalia, vulva, vagina, cervix, minimal mucoid dischacge with some bloody streaking. Bimanual exam limited due to body habitus,  exam chaperoned by NT JIll.   10:49 AM Filed Vitals:   02/11/13 1006  BP: 131/63  Pulse: 61  Temp:   Resp: 16   Patient with slightly elevated blood glucose of 101.  Her genital wet prep shows some moderate leukorrhea without signs of other vaginal infection.  No cervicitis on exam no tenderness with bimanual exam. Hemoglobin is 13.9 and no concern for anemia. Chest x-ray normal.  No concern for acute ischemia on her EKG.  I counseled the patient on medical compliance with her metformin and as well as long-term effects of diabetes.  I will refer the patient to [facility] surgery regarding the  cysts in her axilla.  Patient is seen by for me know women[facility] and I will have her followup with them regarding her irregular menstruation.  Negative pregnancy negative urine.   Arthor Captain, PA-C 02/11/13 1101

## 2013-02-11 NOTE — ED Notes (Signed)
Pt reports that she has been on her period for an abnormal amount of time. States that she has also been having nausea but no vomiting. States that last night she felt like "her heart was squeezing" and then her left leg was numb for awhile. Denies any SOB.

## 2013-02-12 NOTE — ED Provider Notes (Signed)
Medical screening examination/treatment/procedure(s) were performed by non-physician practitioner and as supervising physician I was immediately available for consultation/collaboration.   Laray Anger, DO 02/12/13 1027

## 2013-03-21 ENCOUNTER — Encounter: Payer: Self-pay | Admitting: Obstetrics

## 2013-03-21 ENCOUNTER — Ambulatory Visit (INDEPENDENT_AMBULATORY_CARE_PROVIDER_SITE_OTHER): Payer: Medicaid Other | Admitting: Obstetrics & Gynecology

## 2013-03-21 VITALS — BP 132/89 | HR 72 | Temp 97.4°F | Ht 65.0 in | Wt 272.0 lb

## 2013-03-21 DIAGNOSIS — N926 Irregular menstruation, unspecified: Secondary | ICD-10-CM

## 2013-03-21 DIAGNOSIS — R87611 Atypical squamous cells cannot exclude high grade squamous intraepithelial lesion on cytologic smear of cervix (ASC-H): Secondary | ICD-10-CM

## 2013-03-21 DIAGNOSIS — N939 Abnormal uterine and vaginal bleeding, unspecified: Secondary | ICD-10-CM

## 2013-03-21 DIAGNOSIS — N949 Unspecified condition associated with female genital organs and menstrual cycle: Secondary | ICD-10-CM

## 2013-03-21 DIAGNOSIS — E282 Polycystic ovarian syndrome: Secondary | ICD-10-CM

## 2013-03-21 DIAGNOSIS — N925 Other specified irregular menstruation: Secondary | ICD-10-CM

## 2013-03-21 LAB — HEMOGLOBIN AND HEMATOCRIT, BLOOD: Hemoglobin: 12.7 g/dL (ref 12.0–15.0)

## 2013-03-21 NOTE — Patient Instructions (Addendum)
Etonogestrel implant What is this medicine? ETONOGESTREL is a contraceptive (birth control) device. It is used to prevent pregnancy. It can be used for up to 3 years. This medicine may be used for other purposes; ask your health care provider or pharmacist if you have questions. What should I tell my health care provider before I take this medicine? They need to know if you have any of these conditions: -abnormal vaginal bleeding -blood vessel disease or blood clots -cancer of the breast, cervix, or liver -depression -diabetes -gallbladder disease -headaches -heart disease or recent heart attack -high blood pressure -high cholesterol -kidney disease -liver disease -renal disease -seizures -tobacco smoker -an unusual or allergic reaction to etonogestrel, other hormones, anesthetics or antiseptics, medicines, foods, dyes, or preservatives -pregnant or trying to get pregnant -breast-feeding How should I use this medicine? This device is inserted just under the skin on the inner side of your upper arm by a health care professional. Talk to your pediatrician regarding the use of this medicine in children. Special care may be needed. Overdosage: If you think you've taken too much of this medicine contact a poison control center or emergency room at once. Overdosage: If you think you have taken too much of this medicine contact a poison control center or emergency room at once. NOTE: This medicine is only for you. Do not share this medicine with others. What if I miss a dose? This does not apply. What may interact with this medicine? Do not take this medicine with any of the following medications: -amprenavir -bosentan -fosamprenavir This medicine may also interact with the following medications: -barbiturate medicines for inducing sleep or treating seizures -certain medicines for fungal infections like ketoconazole and itraconazole -griseofulvin -medicines to treat seizures like  carbamazepine, felbamate, oxcarbazepine, phenytoin, topiramate -modafinil -phenylbutazone -rifampin -some medicines to treat HIV infection like atazanavir, indinavir, lopinavir, nelfinavir, tipranavir, ritonavir -St. John's wort This list may not describe all possible interactions. Give your health care provider a list of all the medicines, herbs, non-prescription drugs, or dietary supplements you use. Also tell them if you smoke, drink alcohol, or use illegal drugs. Some items may interact with your medicine. What should I watch for while using this medicine? This product does not protect you against HIV infection (AIDS) or other sexually transmitted diseases. You should be able to feel the implant by pressing your fingertips over the skin where it was inserted. Tell your doctor if you cannot feel the implant. What side effects may I notice from receiving this medicine? Side effects that you should report to your doctor or health care professional as soon as possible: -allergic reactions like skin rash, itching or hives, swelling of the face, lips, or tongue -breast lumps -changes in vision -confusion, trouble speaking or understanding -dark urine -depressed mood -general ill feeling or flu-like symptoms -light-colored stools -loss of appetite, nausea -right upper belly pain -severe headaches -severe pain, swelling, or tenderness in the abdomen -shortness of breath, chest pain, swelling in a leg -signs of pregnancy -sudden numbness or weakness of the face, arm or leg -trouble walking, dizziness, loss of balance or coordination -unusual vaginal bleeding, discharge -unusually weak or tired -yellowing of the eyes or skin Side effects that usually do not require medical attention (Report these to your doctor or health care professional if they continue or are bothersome.): -acne -breast pain -changes in weight -cough -fever or chills -headache -irregular menstrual  bleeding -itching, burning, and vaginal discharge -pain or difficulty passing urine -sore throat This   list may not describe all possible side effects. Call your doctor for medical advice about side effects. You may report side effects to FDA at 1-800-FDA-1088. Where should I keep my medicine? This drug is given in a hospital or clinic and will not be stored at home. NOTE: This sheet is a summary. It may not cover all possible information. If you have questions about this medicine, talk to your doctor, pharmacist, or health care provider.  2013, [employer]. (06/08/2009 3:54:17 PM) Levonorgestrel intrauterine device (IUD) What is this medicine? LEVONORGESTREL IUD (LEE voe nor jes trel) is a contraceptive (birth control) device. The device is placed inside the uterus by a healthcare professional. It is used to prevent pregnancy and can also be used to treat heavy bleeding that occurs during your period. Depending on the device, it can be used for 3 to 5 years. This medicine may be used for other purposes; ask your health care provider or pharmacist if you have questions. What should I tell my health care provider before I take this medicine? They need to know if you have any of these conditions: -abnormal Pap smear -cancer of the breast, uterus, or cervix -diabetes -endometritis -genital or pelvic infection now or in the past -have more than one sexual partner or your partner has more than one partner -heart disease -history of an ectopic or tubal pregnancy -immune system problems -IUD in place -liver disease or tumor -problems with blood clots or take blood-thinners -use intravenous drugs -uterus of unusual shape -vaginal bleeding that has not been explained -an unusual or allergic reaction to levonorgestrel, other hormones, silicone, or polyethylene, medicines, foods, dyes, or preservatives -pregnant or trying to get pregnant -breast-feeding How should I use this  medicine? This device is placed inside the uterus by a health care professional. Talk to your pediatrician regarding the use of this medicine in children. Special care may be needed. Overdosage: If you think you have taken too much of this medicine contact a poison control center or emergency room at once. NOTE: This medicine is only for you. Do not share this medicine with others. What if I miss a dose? This does not apply. What may interact with this medicine? Do not take this medicine with any of the following medications: -amprenavir -bosentan -fosamprenavir This medicine may also interact with the following medications: -aprepitant -barbiturate medicines for inducing sleep or treating seizures -bexarotene -griseofulvin -medicines to treat seizures like carbamazepine, ethotoin, felbamate, oxcarbazepine, phenytoin, topiramate -modafinil -pioglitazone -rifabutin -rifampin -rifapentine -some medicines to treat HIV infection like atazanavir, indinavir, lopinavir, nelfinavir, tipranavir, ritonavir -St. John's wort -warfarin This list may not describe all possible interactions. Give your health care provider a list of all the medicines, herbs, non-prescription drugs, or dietary supplements you use. Also tell them if you smoke, drink alcohol, or use illegal drugs. Some items may interact with your medicine. What should I watch for while using this medicine? Visit your doctor or health care professional for regular check ups. See your doctor if you or your partner has sexual contact with others, becomes HIV positive, or gets a sexual transmitted disease. This product does not protect you against HIV infection (AIDS) or other sexually transmitted diseases. You can check the placement of the IUD yourself by reaching up to the top of your vagina with clean fingers to feel the threads. Do not pull on the threads. It is a good habit to check placement after each menstrual period. Call your  doctor right away if you  feel more of the IUD than just the threads or if you cannot feel the threads at all. The IUD may come out by itself. You may become pregnant if the device comes out. If you notice that the IUD has come out use a backup birth control method like condoms and call your health care provider. Using tampons will not change the position of the IUD and are okay to use during your period. What side effects may I notice from receiving this medicine? Side effects that you should report to your doctor or health care professional as soon as possible: -allergic reactions like skin rash, itching or hives, swelling of the face, lips, or tongue -fever, flu-like symptoms -genital sores -high blood pressure -no menstrual period for 6 weeks during use -pain, swelling, warmth in the leg -pelvic pain or tenderness -severe or sudden headache -signs of pregnancy -stomach cramping -sudden shortness of breath -trouble with balance, talking, or walking -unusual vaginal bleeding, discharge -yellowing of the eyes or skin Side effects that usually do not require medical attention (report to your doctor or health care professional if they continue or are bothersome): -acne -breast pain -change in sex drive or performance -changes in weight -cramping, dizziness, or faintness while the device is being inserted -headache -irregular menstrual bleeding within first 3 to 6 months of use -nausea This list may not describe all possible side effects. Call your doctor for medical advice about side effects. You may report side effects to FDA at 1-800-FDA-1088. Where should I keep my medicine? This does not apply. NOTE: This sheet is a summary. It may not cover all possible information. If you have questions about this medicine, talk to your doctor, pharmacist, or health care provider.  2013, [employer]/Gold Standard. (10/16/2011 1:54:04 PM) Polycystic Ovarian Syndrome Polycystic ovarian syndrome is a  condition with a number of problems. One problem is with the ovaries. The ovaries are organs located in the female pelvis, on each side of the uterus. Usually, during the menstrual cycle, an egg is released from 1 ovary every month. This is called ovulation. When the egg is fertilized, it goes into the womb (uterus), which allows for the growth of a baby. The egg travels from the ovary through the fallopian tube to the uterus. The ovaries also make the hormones estrogen and progesterone. These hormones help the development of a woman's breasts, body shape, and body hair. They also regulate the menstrual cycle and pregnancy. Sometimes, cysts form in the ovaries. A cyst is a fluid-filled sac. On the ovary, different types of cysts can form. The most common type of ovarian cyst is called a functional or ovulation cyst. It is normal, and often forms during the normal menstrual cycle. Each month, a woman's ovaries grow tiny cysts that hold the eggs. When an egg is fully grown, the sac breaks open. This releases the egg. Then, the sac which released the egg from the ovary dissolves. In one type of functional cyst, called a follicle cyst, the sac does not break open to release the egg. It may actually continue to grow. This type of cyst usually disappears within 1 to 3 months.  One type of cyst problem with the ovaries is called Polycystic Ovarian Syndrome (PCOS). In this condition, many follicle cysts form, but do not rupture and produce an egg. This health problem can affect the following:  Menstrual cycle.  Heart.  Obesity.  Cancer of the uterus.  Fertility.  Blood vessels.  Hair growth (face and body)  or baldness.  Hormones.  Appearance.  High blood pressure.  Stroke.  Insulin production.  Inflammation of the liver.  Elevated blood cholesterol and triglycerides. CAUSES   No one knows the exact cause of PCOS.  Women with PCOS often have a mother or sister with PCOS. There is not yet  enough proof to say this is inherited.  Many women with PCOS have a weight problem.  Researchers are looking at the relationship between PCOS and the body's ability to make insulin. Insulin is a hormone that regulates the change of sugar, starches, and other food into energy for the body's use, or for storage. Some women with PCOS make too much insulin. It is possible that the ovaries react by making too many female hormones, called androgens. This can lead to acne, excessive hair growth, weight gain, and ovulation problems.  Too much production of luteinizing hormone (LH) from the pituitary gland in the brain stimulates the ovary to produce too much female hormone (androgen). SYMPTOMS   Infrequent or no menstrual periods, and/or irregular bleeding.  Inability to get pregnant (infertility), because of not ovulating.  Increased growth of hair on the face, chest, stomach, back, thumbs, thighs, or toes.  Acne, oily skin, or dandruff.  Pelvic pain.  Weight gain or obesity, usually carrying extra weight around the waist.  Type 2 diabetes (this is the diabetes that usually does not need insulin).  High cholesterol.  High blood pressure.  Female-pattern baldness or thinning hair.  Patches of thickened and dark brown or black skin on the neck, arms, breasts, or thighs.  Skin tags, or tiny excess flaps of skin, in the armpits or neck area.  Sleep apnea (excessive snoring and breathing stops at times while asleep).  Deepening of the voice.  Gestational diabetes when pregnant.  Increased risk of miscarriage with pregnancy. DIAGNOSIS  There is no single test to diagnose PCOS.   Your caregiver will:  Take a medical history.  Perform a pelvic exam.  Perform an ultrasound.  Check your female and female hormone levels.  Measure glucose or sugar levels in the blood.  Do other blood tests.  If you are producing too many female hormones, your caregiver will make sure it is from PCOS. At  the physical exam, your caregiver will want to evaluate the areas of increased hair growth. Try to allow natural hair growth for a few days before the visit.  During a pelvic exam, the ovaries may be enlarged or swollen by the increased number of small cysts. This can be seen more easily by vaginal ultrasound or screening, to examine the ovaries and lining of the uterus (endometrium) for cysts. The uterine lining may become thicker, if there has not been a regular period. TREATMENT  Because there is no cure for PCOS, it needs to be managed to prevent problems. Treatments are based on your symptoms. Treatment is also based on whether you want to have a baby or whether you need contraception.  Treatment may include:  Progesterone hormone, to start a menstrual period.  Birth control pills, to make you have regular menstrual periods.  Medicines to make you ovulate, if you want to get pregnant.  Medicines to control your insulin.  Medicine to control your blood pressure.  Medicine and diet, to control your high cholesterol and triglycerides in your blood.  Surgery, making small holes in the ovary, to decrease the amount of female hormone production. This is done through a long, lighted tube (laparoscope), placed into the  pelvis through a tiny incision in the lower abdomen. Your caregiver will go over some of the choices with you. WOMEN WITH PCOS HAVE THESE CHARACTERISTICS:  High levels of female hormones called androgens.  An irregular or no menstrual cycle.  May have many small cysts in their ovaries. PCOS is the most common hormonal reproductive problem in women of childbearing age. WHY DO WOMEN WITH PCOS HAVE TROUBLE WITH THEIR MENSTRUAL CYCLE? Each month, about 20 eggs start to mature in the ovaries. As one egg grows and matures, the follicle breaks open to release the egg, so it can travel through the fallopian tube for fertilization. When the single egg leaves the follicle, ovulation takes  place. In women with PCOS, the ovary does not make all of the hormones it needs for any of the eggs to fully mature. They may start to grow and accumulate fluid, but no one egg becomes large enough. Instead, some may remain as cysts. Since no egg matures or is released, ovulation does not occur and the hormone progesterone is not made. Without progesterone, a woman's menstrual cycle is irregular or absent. Also, the cysts produce female hormones, which continue to prevent ovulation.  Document Released: 01/09/2005 Document Revised: 12/08/2011 Document Reviewed: 08/03/2009 [facility] Patient Information 2014 [place], [place].

## 2013-03-21 NOTE — Progress Notes (Signed)
Subjective:     Sherry Duke is a 22 y.o. female here for problem visit.  Current complaints: abnormal vaginal bleeding. Pt reports 2 months ago started a light cycle that did last x 1 month.  Personal health questionnaire reviewed: not asked.   Gynecologic History Patient's last menstrual period was 03/06/2013. Contraception: abstinence Last Pap: 07/14/2011. Results were: abnormal/ ASC-H Last mammogram: n/a. Results were: n/a  Obstetric History OB History   Grav Para Term Preterm Abortions TAB SAB Ect Mult Living   0 0 0 0 0 0 0 0 0 0        The following portions of the patient's history were reviewed and updated as appropriate: allergies, current medications, past family history, past medical history, past social history, past surgical history and problem list.  Review of Systems Pertinent items are noted in HPI.    Objective:   General:  alert     Abdomen: soft, non-tender; bowel sounds normal; no masses,  no organomegaly   Vulva:  normal  Vagina: normal vagina  Cervix:  no lesions  Corpus: normal size, contour, position, consistency, mobility, non-tender  Adnexa:  normal adnexa    Assessment:    H/O ASCUS-H Pap, no f/u PCOS AUB-O  Plan:  COCP x several months Review Pap smear result Return prn

## 2013-03-23 LAB — PAP IG AND CT-NG NAA: GC Probe Amp: NEGATIVE

## 2013-05-12 ENCOUNTER — Ambulatory Visit: Payer: Medicaid Other | Admitting: Obstetrics & Gynecology

## 2013-06-22 ENCOUNTER — Ambulatory Visit: Payer: Medicaid Other | Admitting: Obstetrics & Gynecology

## 2013-11-18 ENCOUNTER — Encounter: Payer: Self-pay | Admitting: Obstetrics

## 2013-11-18 ENCOUNTER — Ambulatory Visit (INDEPENDENT_AMBULATORY_CARE_PROVIDER_SITE_OTHER): Payer: Medicaid Other | Admitting: Obstetrics

## 2013-11-18 VITALS — BP 127/84 | HR 80 | Temp 98.2°F | Ht 66.0 in | Wt 284.0 lb

## 2013-11-18 DIAGNOSIS — Z3202 Encounter for pregnancy test, result negative: Secondary | ICD-10-CM

## 2013-11-18 DIAGNOSIS — Z113 Encounter for screening for infections with a predominantly sexual mode of transmission: Secondary | ICD-10-CM

## 2013-11-18 DIAGNOSIS — Z0000 Encounter for general adult medical examination without abnormal findings: Secondary | ICD-10-CM

## 2013-11-18 DIAGNOSIS — Z3169 Encounter for other general counseling and advice on procreation: Secondary | ICD-10-CM

## 2013-11-18 DIAGNOSIS — Z124 Encounter for screening for malignant neoplasm of cervix: Secondary | ICD-10-CM

## 2013-11-18 LAB — CBC WITH DIFFERENTIAL/PLATELET
BASOS ABS: 0 10*3/uL (ref 0.0–0.1)
BASOS PCT: 0 % (ref 0–1)
Eosinophils Absolute: 0.4 10*3/uL (ref 0.0–0.7)
Eosinophils Relative: 3 % (ref 0–5)
HCT: 39.5 % (ref 36.0–46.0)
Hemoglobin: 13.6 g/dL (ref 12.0–15.0)
LYMPHS PCT: 34 % (ref 12–46)
Lymphs Abs: 4.1 10*3/uL — ABNORMAL HIGH (ref 0.7–4.0)
MCH: 26.3 pg (ref 26.0–34.0)
MCHC: 34.4 g/dL (ref 30.0–36.0)
MCV: 76.4 fL — ABNORMAL LOW (ref 78.0–100.0)
MONO ABS: 0.7 10*3/uL (ref 0.1–1.0)
Monocytes Relative: 6 % (ref 3–12)
NEUTROS ABS: 6.8 10*3/uL (ref 1.7–7.7)
NEUTROS PCT: 57 % (ref 43–77)
Platelets: 210 10*3/uL (ref 150–400)
RBC: 5.17 MIL/uL — ABNORMAL HIGH (ref 3.87–5.11)
RDW: 14.3 % (ref 11.5–15.5)
WBC: 12 10*3/uL — AB (ref 4.0–10.5)

## 2013-11-18 LAB — COMPREHENSIVE METABOLIC PANEL
ALBUMIN: 4.4 g/dL (ref 3.5–5.2)
ALK PHOS: 79 U/L (ref 39–117)
ALT: 16 U/L (ref 0–35)
AST: 18 U/L (ref 0–37)
BUN: 11 mg/dL (ref 6–23)
CALCIUM: 9.6 mg/dL (ref 8.4–10.5)
CHLORIDE: 102 meq/L (ref 96–112)
CO2: 26 mEq/L (ref 19–32)
Creat: 0.79 mg/dL (ref 0.50–1.10)
Glucose, Bld: 96 mg/dL (ref 70–99)
POTASSIUM: 4.2 meq/L (ref 3.5–5.3)
SODIUM: 139 meq/L (ref 135–145)
TOTAL PROTEIN: 7.3 g/dL (ref 6.0–8.3)
Total Bilirubin: 0.2 mg/dL (ref 0.2–1.2)

## 2013-11-18 LAB — TSH: TSH: 1.708 u[IU]/mL (ref 0.350–4.500)

## 2013-11-18 LAB — HEPATITIS C ANTIBODY: HCV AB: NEGATIVE

## 2013-11-18 LAB — HEPATITIS B SURFACE ANTIGEN: HEP B S AG: NEGATIVE

## 2013-11-18 LAB — HIV ANTIBODY (ROUTINE TESTING W REFLEX): HIV: NONREACTIVE

## 2013-11-18 LAB — POCT URINE PREGNANCY: PREG TEST UR: NEGATIVE

## 2013-11-18 MED ORDER — VITAFOL-ONE 29-1-200 MG PO CAPS: 1.0000 | ORAL_CAPSULE | Freq: Every day | ORAL | Status: DC

## 2013-11-18 NOTE — Progress Notes (Signed)
Subjective:     Sherry Duke is a 23 y.o. female here for a routine exam.  Current complaints: Patient is in the office today for an annual exam. Patient states she is having a clear slimy discharge. Patient states for about two weeks now the discharge has been blood tinged and that she has a bloody show when she wipes. Patient would like a UPT preformed today and would like blood work for STDs.   Personal health questionnaire reviewed: yes.   Gynecologic History No LMP recorded. Contraception: none Last Pap: 03/21/2013. Results were: abnormal  Obstetric History OB History  Gravida Para Term Preterm AB SAB TAB Ectopic Multiple Living  0 0 0 0 0 0 0 0 0 0          The following portions of the patient's history were reviewed and updated as appropriate: allergies, current medications, past family history, past medical history, past social history, past surgical history and problem list.  Review of Systems Pertinent items are noted in HPI.    Objective:    General appearance: alert and no distress Breasts: normal appearance, no masses or tenderness Abdomen: normal findings: soft, non-tender Pelvic: cervix normal in appearance, external genitalia normal, no adnexal masses or tenderness, no cervical motion tenderness, rectovaginal septum normal, uterus normal size, shape, and consistency and vagina normal without discharge    Assessment:    Healthy female exam.   Preconception counseling.    Plan:    Education reviewed: safe sex/STD prevention and preconception counseling.. Contraception: none and trying to get pregnant. Follow up in: 1 year. PNV's Rx

## 2013-11-19 LAB — GC/CHLAMYDIA PROBE AMP
CT PROBE, AMP APTIMA: NEGATIVE
GC PROBE AMP APTIMA: NEGATIVE

## 2013-11-19 LAB — WET PREP BY MOLECULAR PROBE
Candida species: NEGATIVE
GARDNERELLA VAGINALIS: POSITIVE — AB
Trichomonas vaginosis: NEGATIVE

## 2013-11-19 LAB — RPR

## 2013-11-21 LAB — PAP IG W/ RFLX HPV ASCU

## 2013-11-28 ENCOUNTER — Other Ambulatory Visit: Payer: Self-pay | Admitting: *Deleted

## 2013-11-28 DIAGNOSIS — N760 Acute vaginitis: Principal | ICD-10-CM

## 2013-11-28 DIAGNOSIS — B9689 Other specified bacterial agents as the cause of diseases classified elsewhere: Secondary | ICD-10-CM

## 2013-11-28 MED ORDER — METRONIDAZOLE 500 MG PO TABS: 500.0000 mg | ORAL_TABLET | Freq: Two times a day (BID) | ORAL | Status: DC

## 2013-12-08 ENCOUNTER — Encounter: Payer: Self-pay | Admitting: Obstetrics

## 2013-12-08 ENCOUNTER — Ambulatory Visit (INDEPENDENT_AMBULATORY_CARE_PROVIDER_SITE_OTHER): Payer: Medicaid Other | Admitting: Obstetrics

## 2013-12-08 ENCOUNTER — Other Ambulatory Visit: Payer: Self-pay | Admitting: Obstetrics

## 2013-12-08 VITALS — BP 124/78 | HR 73 | Temp 97.6°F | Ht 66.0 in | Wt 278.0 lb

## 2013-12-08 DIAGNOSIS — Z01812 Encounter for preprocedural laboratory examination: Secondary | ICD-10-CM

## 2013-12-08 DIAGNOSIS — R87612 Low grade squamous intraepithelial lesion on cytologic smear of cervix (LGSIL): Secondary | ICD-10-CM

## 2013-12-08 DIAGNOSIS — Z3202 Encounter for pregnancy test, result negative: Secondary | ICD-10-CM

## 2013-12-08 DIAGNOSIS — Z113 Encounter for screening for infections with a predominantly sexual mode of transmission: Secondary | ICD-10-CM

## 2013-12-08 LAB — POCT URINE PREGNANCY: Preg Test, Ur: NEGATIVE

## 2013-12-08 NOTE — Progress Notes (Signed)
Colposcopy Procedure Note  Indications: Pap smear 3 weeks ago showed: low-grade squamous intraepithelial neoplasia (LGSIL - encompassing HPV,mild dysplasia,CIN I). The prior pap showed ASC cannot exclude high grade lesion [facility]  Prior cervical/vaginal disease: normal exam without visible pathology. Prior cervical treatment: no treatment.  Procedure Details  The risks and benefits of the procedure and Written informed consent obtained.  A time-out was performed confirming the patient, procedure and allergy status  Speculum placed in vagina and excellent visualization of cervix achieved, cervix swabbed x 3 with acetic acid solution.  Findings: Cervix: no visible lesions; SCJ visualized - lesion at 1 o'clock.   Vaginal inspection: normal without visible lesions. Vulvar colposcopy: vulvar colposcopy not performed.    Specimens: ECC and Cervical Biopsies at 1, 6 and 11 o'clock.  Complications: none.  Plan: Specimens labelled and sent to Pathology. Will base further treatment on Pathology findings. Post biopsy instructions given to patient. Return to discuss Pathology results in 2 weeks.

## 2013-12-09 ENCOUNTER — Encounter: Payer: Self-pay | Admitting: Obstetrics

## 2013-12-09 LAB — WET PREP BY MOLECULAR PROBE
Candida species: NEGATIVE
Gardnerella vaginalis: NEGATIVE
Trichomonas vaginosis: NEGATIVE

## 2013-12-09 LAB — GC/CHLAMYDIA PROBE AMP
CT Probe RNA: NEGATIVE
GC Probe RNA: NEGATIVE

## 2013-12-20 ENCOUNTER — Ambulatory Visit: Payer: Medicaid Other | Admitting: Obstetrics

## 2013-12-27 ENCOUNTER — Ambulatory Visit (INDEPENDENT_AMBULATORY_CARE_PROVIDER_SITE_OTHER): Payer: Medicaid Other | Admitting: Obstetrics

## 2013-12-27 VITALS — BP 129/83 | HR 62 | Temp 98.2°F | Wt 275.0 lb

## 2013-12-27 DIAGNOSIS — N870 Mild cervical dysplasia: Secondary | ICD-10-CM

## 2013-12-27 NOTE — Progress Notes (Signed)
Subjective:     Sherry Duke is a 23 y.o. female here for a follow up colposcopy exam.  Current complaints: Pt here for f/u colpo on 12/08/13. pt states that she is doing well following colposcopy.  Pt states that she has had some lower pelvic pain.  Pt states that this has just started this morning.  Pt states that she would like to discuss need for metronidazole.   Personal health questionnaire reviewed: yes.   Gynecologic History Patient's last menstrual period was 11/28/2013. Contraception: none   Obstetric History OB History  Gravida Para Term Preterm AB SAB TAB Ectopic Multiple Living  0 0 0 0 0 0 0 0 0 0          The following portions of the patient's history were reviewed and updated as appropriate: allergies, current medications, past family history, past medical history, past social history, past surgical history and problem list.  Review of Systems Pertinent items are noted in HPI.    Objective:    No exam performed today, Consult only..    100% of 15 min visit spent on counseling and coordination of care.   Assessment:    LGSIL   Plan:    Education reviewed: safe sex/STD prevention and management of cervical dysplasia. Follow up in: 6 months. Repeat pap q 6 months, per Dysplasia Protocol.

## 2014-01-01 ENCOUNTER — Encounter: Payer: Self-pay | Admitting: Obstetrics

## 2014-03-21 ENCOUNTER — Emergency Department (HOSPITAL_COMMUNITY)
Admission: EM | Admit: 2014-03-21 | Discharge: 2014-03-21 | Disposition: A | Discharge status: Home / Self Care | Payer: Medicaid Other | Attending: Emergency Medicine | Admitting: Emergency Medicine

## 2014-03-21 ENCOUNTER — Emergency Department (HOSPITAL_COMMUNITY): Payer: Medicaid Other

## 2014-03-21 ENCOUNTER — Encounter (HOSPITAL_COMMUNITY): Payer: Self-pay | Admitting: Emergency Medicine

## 2014-03-21 DIAGNOSIS — S99919A Unspecified injury of unspecified ankle, initial encounter: Principal | ICD-10-CM

## 2014-03-21 DIAGNOSIS — Y9289 Other specified places as the place of occurrence of the external cause: Secondary | ICD-10-CM | POA: Insufficient documentation

## 2014-03-21 DIAGNOSIS — S8990XA Unspecified injury of unspecified lower leg, initial encounter: Secondary | ICD-10-CM | POA: Insufficient documentation

## 2014-03-21 DIAGNOSIS — Y9389 Activity, other specified: Secondary | ICD-10-CM | POA: Insufficient documentation

## 2014-03-21 DIAGNOSIS — S8992XA Unspecified injury of left lower leg, initial encounter: Secondary | ICD-10-CM

## 2014-03-21 DIAGNOSIS — Z87891 Personal history of nicotine dependence: Secondary | ICD-10-CM | POA: Insufficient documentation

## 2014-03-21 DIAGNOSIS — S99929A Unspecified injury of unspecified foot, initial encounter: Principal | ICD-10-CM

## 2014-03-21 DIAGNOSIS — Z8742 Personal history of other diseases of the female genital tract: Secondary | ICD-10-CM | POA: Insufficient documentation

## 2014-03-21 DIAGNOSIS — W108XXA Fall (on) (from) other stairs and steps, initial encounter: Secondary | ICD-10-CM | POA: Insufficient documentation

## 2014-03-21 MED ORDER — HYDROCODONE-ACETAMINOPHEN 5-325 MG PO TABS: 1.0000 | ORAL_TABLET | ORAL | Status: DC | PRN

## 2014-03-21 MED ORDER — OXYCODONE-ACETAMINOPHEN 5-325 MG PO TABS
1.0000 | ORAL_TABLET | Freq: Once | ORAL | Status: AC
  Administered 2014-03-21: 1 via ORAL
  Filled 2014-03-21: qty 1

## 2014-03-21 NOTE — ED Notes (Signed)
Patient fell down steps last night.  She was seen by ems around midnight.  Today her pain has increased, sheh as more swelling and decreased rom due to pain and swelling.  Patient took aleve today for pain

## 2014-03-21 NOTE — ED Provider Notes (Signed)
CSN: [id number]     Arrival date & time 03/21/14  1429 History  This chart was scribed for non-physician practitioner, Emilia BeckKaitlyn Shavawn Stobaugh, PA-C, working with Ethelda ChickMartha K Linker, MD, by Bronson CurbJacqueline Melvin, ED Scribe. This patient was seen in room TR06C/TR06C and the patient's care was started at 3:12 PM.    Chief Complaint  Patient presents with  . Knee Pain     Patient is a 23 y.o. female presenting with knee pain. The history is provided by the patient. No language interpreter was used.  Knee Pain Location:  Knee Knee location:  L knee Pain details:    Severity:  Moderate   Onset quality:  Sudden   Timing:  Constant   Progression:  Worsening Chronicity:  New Dislocation: no   Foreign body present:  No foreign bodies Prior injury to area:  No Relieved by:  Immobilization Associated symptoms: decreased ROM, stiffness and swelling   Associated symptoms: no back pain, no fatigue, no fever, no neck pain and no numbness   Risk factors: no concern for non-accidental trauma, no frequent fractures, no known bone disorder and no recent illness     HPI Comments: [place] H Hohen[place] is a 23 y.o. female who presents to the Emergency Department complaining of left knee pain that began last night. Patient states she was trying to catch the bus and missed four steps. She was seen by EMS shortly after, and was told to apply ice to the area. She states she has applied ice, however, her symptoms have gotten worse. There is associated swelling and decreased ROM. She states the pain radiates from her knee down to her foot. Patient reports the pain is worse with flexion and extension. She has taken Aleve today without improvement. Patient has no history of recent knee surgery.  Past Medical History  Diagnosis Date  . Irregular menstrual cycle   . Prediabetes    Past Surgical History  Procedure Laterality Date  . No past surgeries     Family History  Problem Relation Age of Onset  . Adopted: Yes  .  Sickle cell anemia Sister    History  Substance Use Topics  . Smoking status: Former Games developermoker  . Smokeless tobacco: Never Used  . Alcohol Use: No   OB History   Grav Para Term Preterm Abortions TAB SAB Ect Mult Living   0 0 0 0 0 0 0 0 0 0      Review of Systems  Constitutional: Negative for fever and fatigue.  Musculoskeletal: Positive for arthralgias (left knee), joint swelling (left knee) and stiffness. Negative for back pain and neck pain.  All other systems reviewed and are negative.     Allergies  Review of patient's allergies indicates no known allergies.  Home Medications   Prior to Admission medications   Medication Sig Start Date End Date Taking? Authorizing Provider  Prenatal Vit-FePoly-FA-DHA (VITAFOL-ONE) 29-1-200 MG CAPS Take 1 capsule by mouth daily before breakfast. 11/18/13   Brock Badharles A Harper, MD   Triage Vitals: BP 131/80  Pulse 77  Temp(Src) 98.3 F (36.8 C) (Oral)  Resp 22  Ht 5\' 6"  (1.676 m)  Wt 275 lb (124.739 kg)  BMI 44.41 kg/m2  SpO2 97%  Physical Exam  Nursing note and vitals reviewed. Constitutional: She is oriented to person, place, and time. She appears well-developed and well-nourished. No distress.  HENT:  Head: Normocephalic and atraumatic.  Eyes: Conjunctivae and EOM are normal.  Neck: Neck supple. No tracheal deviation present.  Cardiovascular:  Normal rate.   Pulmonary/Chest: Effort normal. No respiratory distress.  Musculoskeletal: Normal range of motion.  Neurological: She is alert and oriented to person, place, and time.  Skin: Skin is warm and dry.  Psychiatric: She has a normal mood and affect. Her behavior is normal.    ED Course  Procedures (including critical care time)  DIAGNOSTIC STUDIES: Oxygen Saturation is 97% on room air, adequate by my interpretation.    COORDINATION OF CARE: At 1517 Discussed treatment plan with patient which includes imaging and crutches. Patient agrees.   SPLINT APPLICATION Date/Time:  1:11 PM Authorized by: Emilia BeckKaitlyn Juhi Lagrange Consent: Verbal consent obtained. Risks and benefits: risks, benefits and alternatives were discussed Consent given by: patient Splint applied by: orthopedic technician Location details: left knee Splint type: knee immobilizer Supplies used: knee immobilizer Post-procedure: The splinted body part was neurovascularly unchanged following the procedure. Patient tolerance: Patient tolerated the procedure well with no immediate complications.    Labs Review Labs Reviewed - No data to display  Imaging Review Dg Knee Complete 4 Views Left  03/21/2014   CLINICAL DATA:  Post fall (03/20/2014) now with generalized knee pain.  EXAM: LEFT KNEE - COMPLETE 4+ VIEW  COMPARISON:  None.  FINDINGS: Small to moderate-sized joint effusion. No fracture or dislocation. No evidence of lipohemarthrosis. Joint spaces are preserved. No evidence of chondrocalcinosis. Regional soft tissues appear normal. No radiopaque foreign body.  IMPRESSION: Small to moderate-sized joint effusion. Otherwise, no acute findings.   Electronically Signed   By: Simonne ComeJohn  Watts M.D.   On: 03/21/2014 16:22     EKG Interpretation None      MDM   Final diagnoses:  Left knee injury, initial encounter    Patient's xray shows no acute changes except for joint effusion. Patient will have knee immobilizer, crutches and orthopedic follow up. Patient will have vicodin for pain. No neurovascular compromise. No other injuries. Vitals stable and patient afebrile.   I personally performed the services described in this documentation, which was scribed in my presence. The recorded information has been reviewed and is accurate.    Emilia BeckKaitlyn Kean Gautreau, PA-C 03/22/14 1312

## 2014-03-21 NOTE — Discharge Instructions (Signed)
Take Vicodin as needed for pain. Follow up with Dr. Shelle IronBeane for further evaluation of your knee pain. Refer to attached documents for more information.

## 2014-03-21 NOTE — ED Notes (Signed)
Patient states she will take the bus home

## 2014-03-23 NOTE — ED Provider Notes (Signed)
Medical screening examination/treatment/procedure(s) were performed by non-physician practitioner and as supervising physician I was immediately available for consultation/collaboration.   EKG Interpretation None        Rolland PorterMark James, MD 03/23/14 [phone]

## 2014-03-24 ENCOUNTER — Emergency Department (HOSPITAL_COMMUNITY)
Admission: EM | Admit: 2014-03-24 | Discharge: 2014-03-24 | Disposition: A | Discharge status: Home / Self Care | Payer: Medicaid Other | Attending: Emergency Medicine | Admitting: Emergency Medicine

## 2014-03-24 ENCOUNTER — Encounter (HOSPITAL_COMMUNITY): Payer: Self-pay | Admitting: Emergency Medicine

## 2014-03-24 DIAGNOSIS — Z79899 Other long term (current) drug therapy: Secondary | ICD-10-CM | POA: Insufficient documentation

## 2014-03-24 DIAGNOSIS — Z8742 Personal history of other diseases of the female genital tract: Secondary | ICD-10-CM | POA: Insufficient documentation

## 2014-03-24 DIAGNOSIS — M25569 Pain in unspecified knee: Secondary | ICD-10-CM | POA: Insufficient documentation

## 2014-03-24 DIAGNOSIS — Z87891 Personal history of nicotine dependence: Secondary | ICD-10-CM | POA: Insufficient documentation

## 2014-03-24 DIAGNOSIS — M25562 Pain in left knee: Secondary | ICD-10-CM

## 2014-03-24 DIAGNOSIS — Z113 Encounter for screening for infections with a predominantly sexual mode of transmission: Secondary | ICD-10-CM | POA: Insufficient documentation

## 2014-03-24 LAB — WET PREP, GENITAL
Clue Cells Wet Prep HPF POC: NONE SEEN
TRICH WET PREP: NONE SEEN
Yeast Wet Prep HPF POC: NONE SEEN

## 2014-03-24 MED ORDER — CYCLOBENZAPRINE HCL 10 MG PO TABS
5.0000 mg | ORAL_TABLET | Freq: Once | ORAL | Status: AC
  Administered 2014-03-24: 5 mg via ORAL
  Filled 2014-03-24: qty 1

## 2014-03-24 MED ORDER — CYCLOBENZAPRINE HCL 10 MG PO TABS: 10.0000 mg | ORAL_TABLET | Freq: Two times a day (BID) | ORAL | Status: DC | PRN

## 2014-03-24 MED ORDER — IBUPROFEN 600 MG PO TABS: 600.0000 mg | ORAL_TABLET | Freq: Four times a day (QID) | ORAL | Status: DC | PRN

## 2014-03-24 MED ORDER — IBUPROFEN 800 MG PO TABS
800.0000 mg | ORAL_TABLET | Freq: Once | ORAL | Status: AC
  Administered 2014-03-24: 800 mg via ORAL
  Filled 2014-03-24: qty 1

## 2014-03-24 MED ORDER — ONDANSETRON 4 MG PO TBDP
4.0000 mg | ORAL_TABLET | Freq: Once | ORAL | Status: AC
  Administered 2014-03-24: 4 mg via ORAL
  Filled 2014-03-24: qty 1

## 2014-03-24 MED ORDER — ONDANSETRON HCL 4 MG PO TABS: 4.0000 mg | ORAL_TABLET | Freq: Four times a day (QID) | ORAL | Status: DC

## 2014-03-24 NOTE — ED Notes (Signed)
Pt verbally aggressive towards staff, upset that we did not immediatly come to take her to rest room, pt rude to staff members tec and staff, I attempted to put on socks.  I had to explain to patient that it was policy we could not let her go bare foot.  Pt stated threat that she may lose control of her foot and hit me in face.

## 2014-03-24 NOTE — Discharge Instructions (Signed)
Knee Pain The knee is the complex joint between your thigh and your lower leg. It is made up of bones, tendons, ligaments, and cartilage. The bones that make up the knee are:  The femur in the thigh.  The tibia and fibula in the lower leg.  The patella or kneecap riding in the groove on the lower femur. CAUSES  Knee pain is a common complaint with many causes. A few of these causes are:  Injury, such as:  A ruptured ligament or tendon injury.  Torn cartilage.  Medical conditions, such as:  Gout  Arthritis  Infections  Overuse, over training, or overdoing a physical activity. Knee pain can be minor or severe. Knee pain can accompany debilitating injury. Minor knee problems often respond well to self-care measures or get well on their own. More serious injuries may need medical intervention or even surgery. SYMPTOMS The knee is complex. Symptoms of knee problems can vary widely. Some of the problems are:  Pain with movement and weight bearing.  Swelling and tenderness.  Buckling of the knee.  Inability to straighten or extend your knee.  Your knee locks and you cannot straighten it.  Warmth and redness with pain and fever.  Deformity or dislocation of the kneecap. DIAGNOSIS  Determining what is wrong may be very straight forward such as when there is an injury. It can also be challenging because of the complexity of the knee. Tests to make a diagnosis may include:  Your caregiver taking a history and doing a physical exam.  Routine X-rays can be used to rule out other problems. X-rays will not reveal a cartilage tear. Some injuries of the knee can be diagnosed by:  Arthroscopy a surgical technique by which a small video camera is inserted through tiny incisions on the sides of the knee. This procedure is used to examine and repair internal knee joint problems. Tiny instruments can be used during arthroscopy to repair the torn knee cartilage  (meniscus).  Arthrography is a radiology technique. A contrast liquid is directly injected into the knee joint. Internal structures of the knee joint then become visible on X-ray film.  An MRI scan is a non X-ray radiology procedure in which magnetic fields and a computer produce two- or three-dimensional images of the inside of the knee. Cartilage tears are often visible using an MRI scanner. MRI scans have largely replaced arthrography in diagnosing cartilage tears of the knee.  Blood work.  Examination of the fluid that helps to lubricate the knee joint (synovial fluid). This is done by taking a sample out using a needle and a syringe. TREATMENT The treatment of knee problems depends on the cause. Some of these treatments are:  Depending on the injury, proper casting, splinting, surgery, or physical therapy care will be needed.  Give yourself adequate recovery time. Do not overuse your joints. If you begin to get sore during workout routines, back off. Slow down or do fewer repetitions.  For repetitive activities such as cycling or running, maintain your strength and nutrition.  Alternate muscle groups. For example, if you are a weight lifter, work the upper body on one day and the lower body the next.  Either tight or weak muscles do not give the proper support for your knee. Tight or weak muscles do not absorb the stress placed on the knee joint. Keep the muscles surrounding the knee strong.  Take care of mechanical problems.  If you have flat feet, orthotics or special shoes may help.  See your caregiver if you need help.  Arch supports, sometimes with wedges on the inner or outer aspect of the heel, can help. These can shift pressure away from the side of the knee most bothered by osteoarthritis.  A brace called an "unloader" brace also may be used to help ease the pressure on the most arthritic side of the knee.  If your caregiver has prescribed crutches, braces, wraps or ice,  use as directed. The acronym for this is PRICE. This means protection, rest, ice, compression, and elevation.  Nonsteroidal anti-inflammatory drugs (NSAIDs), can help relieve pain. But if taken immediately after an injury, they may actually increase swelling. Take NSAIDs with food in your stomach. Stop them if you develop stomach problems. Do not take these if you have a history of ulcers, stomach pain, or bleeding from the bowel. Do not take without your caregiver's approval if you have problems with fluid retention, heart failure, or kidney problems.  For ongoing knee problems, physical therapy may be helpful.  Glucosamine and chondroitin are over-the-counter dietary supplements. Both may help relieve the pain of osteoarthritis in the knee. These medicines are different from the usual anti-inflammatory drugs. Glucosamine may decrease the rate of cartilage destruction.  Injections of a corticosteroid drug into your knee joint may help reduce the symptoms of an arthritis flare-up. They may provide pain relief that lasts a few months. You may have to wait a few months between injections. The injections do have a small increased risk of infection, water retention, and elevated blood sugar levels.  Hyaluronic acid injected into damaged joints may ease pain and provide lubrication. These injections may work by reducing inflammation. A series of shots may give relief for as long as 6 months.  Topical painkillers. Applying certain ointments to your skin may help relieve the pain and stiffness of osteoarthritis. Ask your pharmacist for suggestions. Many over the-counter products are approved for temporary relief of arthritis pain.  In some countries, doctors often prescribe topical NSAIDs for relief of chronic conditions such as arthritis and tendinitis. A review of treatment with NSAID creams found that they worked as well as oral medications but without the serious side effects. PREVENTION  Maintain a  healthy weight. Extra pounds put more strain on your joints.  Get strong, stay limber. Weak muscles are a common cause of knee injuries. Stretching is important. Include flexibility exercises in your workouts.  Be smart about exercise. If you have osteoarthritis, chronic knee pain or recurring injuries, you may need to change the way you exercise. This does not mean you have to stop being active. If your knees ache after jogging or playing basketball, consider switching to swimming, water aerobics, or other low-impact activities, at least for a few days a week. Sometimes limiting high-impact activities will provide relief.  Make sure your shoes fit well. Choose footwear that is right for your sport.  Protect your knees. Use the proper gear for knee-sensitive activities. Use kneepads when playing volleyball or laying carpet. Buckle your seat belt every time you drive. Most shattered kneecaps occur in car accidents.  Rest when you are tired. SEEK MEDICAL CARE IF:  You have knee pain that is continual and does not seem to be getting better.  SEEK IMMEDIATE MEDICAL CARE IF:  Your knee joint feels hot to the touch and you have a high fever. MAKE SURE YOU:   Understand these instructions.  Will watch your condition.  Will get help right away if you are not  doing well or get worse. Document Released: 07/13/2007 Document Revised: 12/08/2011 Document Reviewed: 07/13/2007 [facility] Patient Information 2015 [place], [place]. This information is not intended to replace advice given to you by your health care provider. Make sure you discuss any questions you have with your health care provider. Safe Sex Safe sex is about reducing the risk of giving or getting a sexually transmitted disease (STD). STDs are spread through sexual contact involving the genitals, mouth, or rectum. Some STDs can be cured and others cannot. Safe sex can also prevent unintended pregnancies.  WHAT ARE SOME SAFE SEX  PRACTICES?  Limit your sexual activity to only one partner who is only having sex with you.  Talk to your partner about his or her past partners, past STDs, and drug use.  Use a condom every time you have sexual intercourse. This includes vaginal, oral, and anal sexual activity. Both females and males should wear condoms during oral sex. Only use latex or polyurethane condoms and water-based lubricants. Using petroleum-based lubricants or oils to lubricate a condom will weaken the condom and increase the chance that it will break. The condom should be in place from the beginning to the end of sexual activity. Wearing a condom reduces, but does not completely eliminate, your risk of getting or giving an STD. STDs can be spread by contact with infected body fluids and skin.  Get vaccinated for hepatitis B and HPV.  Avoid alcohol and recreational drugs which can affect your judgement. You may forget to use a condom or participate in high-risk sex.  For females, avoid douching after sexual intercourse. Douching can spread an infection farther into the reproductive tract.  Check your body for signs of sores, blisters, rashes, or unusual discharge. See your health care provider if you notice any of these signs.  Avoid sexual contact if you have symptoms of an infection or are being treated for an STD. If you or your partner has herpes, avoid sexual contact when blisters are present. Use condoms at all other times.  If you are at risk of being infected with HIV, it is recommended that you take a prescription medicine daily to prevent HIV infection. This is called pre-exposure prophylaxis (PrEP). You are considered at risk if:  You are a man who has sex with other men (MSM).  You are a heterosexual man or woman who is sexually active with more than one partner.  You take drugs by injection.  You are sexually active with a partner who has HIV.  Talk with your health care provider about whether you  are at high risk of being infected with HIV. If you choose to begin PrEP, you should first be tested for HIV. You should then be tested every 3 months for as long as you are taking PrEP.  See your health care provider for regular screenings, exams, and tests for other STDs. Before having sex with a new partner, each of you should be screened for STDs and should talk about the results with each other. WHAT ARE THE BENEFITS OF SAFE SEX?   There is less chance of getting or giving an STD.  You can prevent unwanted or unintended pregnancies.  By discussing safe sex concerns with your partner, you may increase feelings of intimacy, comfort, trust, and honesty between the two of you. Document Released: 10/23/2004 Document Revised: 09/20/2013 Document Reviewed: 03/08/2012 [facility] Patient Information 2015 [place], [place]. This information is not intended to replace advice given to you by your health care  provider. Make sure you discuss any questions you have with your health care provider. ° °

## 2014-03-24 NOTE — ED Notes (Addendum)
As soon as pt was brought in room, she started yelling that she needs help, she is arguing with staff, being very disrespectful, stating "You have no business talking to that woman in hallway, when I'm th one needing help" Pt was helped to wheelchair and taken to bathroom

## 2014-03-24 NOTE — ED Provider Notes (Signed)
CSN: [id number]     Arrival date & time 03/24/14  1548 History   First MD Initiated Contact with Patient 03/24/14 1607     Chief Complaint  Patient presents with  . Leg Pain  . Gynecologic Exam     (Consider location/radiation/quality/duration/timing/severity/associated sxs/prior Treatment) HPI  Patient possess the emergency department by EMS with complaints of left knee pain. She was told this past Monday that she damaged her anterior cruciate ligament on that side and was given hydrocodone. She reports that she was not given any anti-inflammatories but was given hydrocodone has not been controlling her pain and has been making her very nauseous. She has been using her crutches, knee immobilizer and is supposed to be seeing orthopedic specialist soon. She is concerned that her knee is hurting more than it was before. She also reports that her partner has been unfaithful to her and would like to get evaluated for possible STD or infection. She denies having any pelvic pain discharge, dysuria, hematuria. She's not had fevers, vomiting or diarrhea.  Past Medical History  Diagnosis Date  . Irregular menstrual cycle   . Prediabetes    Past Surgical History  Procedure Laterality Date  . No past surgeries     Family History  Problem Relation Age of Onset  . Adopted: Yes  . Sickle cell anemia Sister    History  Substance Use Topics  . Smoking status: Former Games developermoker  . Smokeless tobacco: Never Used  . Alcohol Use: No   OB History   Grav Para Term Preterm Abortions TAB SAB Ect Mult Living   0 0 0 0 0 0 0 0 0 0      Review of Systems    Review of Systems  Gen: no weight loss, fevers, chills, night sweats  Eyes: no discharge or drainage, no occular pain or visual changes  Nose: no epistaxis or rhinorrhea  Mouth: no dental pain, no sore throat  Neck: no neck pain  Lungs:No wheezing, coughing or hemoptysis CV: no chest pain, palpitations, dependent edema or orthopnea  Abd: no  abdominal pain, nausea, vomiting, diarrhea GU: no dysuria or gross hematuria  MSK:  No muscle weakness + left knee pain Neuro: no headache, no focal neurologic deficits  Skin: no rash or wounds Psyche: no complaints   Allergies  Review of patient's allergies indicates no known allergies.  Home Medications   Prior to Admission medications   Medication Sig Start Date End Date Taking? Authorizing Arla Boutwell  HYDROcodone-acetaminophen (NORCO/VICODIN) 5-325 MG per tablet Take 1-2 tablets by mouth every 4 (four) hours as needed for moderate pain or severe pain. 03/21/14  Yes Kaitlyn Szekalski, PA-C  cyclobenzaprine (FLEXERIL) 10 MG tablet Take 1 tablet (10 mg total) by mouth 2 (two) times daily as needed for muscle spasms. 03/24/14   Tiffany Irine SealG Greene, PA-C  ibuprofen (ADVIL,MOTRIN) 600 MG tablet Take 1 tablet (600 mg total) by mouth every 6 (six) hours as needed. 03/24/14   Tiffany Irine SealG Greene, PA-C  ondansetron (ZOFRAN) 4 MG tablet Take 1 tablet (4 mg total) by mouth every 6 (six) hours. 03/24/14   Tiffany Irine SealG Greene, PA-C   BP 135/82  Pulse 68  Temp(Src) 98.3 F (36.8 C) (Oral)  Resp 16  SpO2 96%  LMP 03/17/2014 Physical Exam  Nursing note and vitals reviewed. Constitutional: She appears well-developed and well-nourished. No distress.  HENT:  Head: Normocephalic and atraumatic.  Eyes: Pupils are equal, round, and reactive to light.  Neck: Normal range of motion.  Neck supple.  Cardiovascular: Normal rate and regular rhythm.   Pulmonary/Chest: Effort normal.  Abdominal: Soft.  Genitourinary: Vagina normal and uterus normal. Cervix exhibits no motion tenderness, no discharge and no friability. No tenderness or bleeding around the vagina. No vaginal discharge found.  Musculoskeletal:       Left knee: She exhibits decreased range of motion (due to pain) and swelling. She exhibits no effusion, no ecchymosis, no laceration and no erythema. Tenderness (diffuse to touch and with movement. No redness  or induration) found. Lateral joint line tenderness noted.  Neurological: She is alert.  Skin: Skin is warm and dry.    ED Course  Procedures (including critical care time) Labs Review Labs Reviewed  WET PREP, GENITAL - Abnormal; Notable for the following:    WBC, Wet Prep HPF POC FEW (*)    All other components within normal limits  GC/CHLAMYDIA PROBE AMP    Imaging Review No results found.   EKG Interpretation None      MDM   Final diagnoses:  Screening for STD (sexually transmitted disease)  Left knee pain    Patient to the ER with left knee pain and requesting screening for STD's. Her pelvic exam was normal and her labs came back not showing any significant findings to suggest infection, a few WBC's, asymptomatic so cultures sent out. I gave her Ibuprofen and nausea medication. Will prescribe these for her as well. Encouraged her to follow-up with Ortho. No other intervention or evaluation needed at this time.  23 y.o.Sherry Duke's evaluation in the Emergency Department is complete. It has been determined that no acute conditions requiring further emergency intervention are present at this time. The patient/guardian have been advised of the diagnosis and plan. We have discussed signs and symptoms that warrant return to the ED, such as changes or worsening in symptoms.  Vital signs are stable at discharge. Filed Vitals:   03/24/14 1553  BP: 135/82  Pulse: 68  Temp: 98.3 F (36.8 C)  Resp: 16    Patient/guardian has voiced understanding and agreed to follow-up with the PCP or specialist.     Dorthula Matasiffany G Greene, PA-C 03/24/14 1756

## 2014-03-24 NOTE — ED Notes (Addendum)
Upon assessment of patient, patient is now requesting to be checked for STDs because she has had unprotected intercourse with her partner, who recently cheated on her. Pt denies vaginal discharge, however reports spotting.

## 2014-03-24 NOTE — ED Provider Notes (Signed)
Complains of continued pain at left knee since fall which occurred 3 days ago. States that hydrocodone prescribed makes her dizzy and nauseated.  Doug SouSam Jacubowitz, MD 03/24/14 [phone]

## 2014-03-24 NOTE — ED Notes (Signed)
Per EMS, Pt told on Monday that she has torn her ACL on left side.  Pt given crutches and brace with pain meds and sent home.  Pain has increased and pt states more swollen.  Vitals: 154/90, 74, resp 20

## 2014-03-25 LAB — GC/CHLAMYDIA PROBE AMP
CT Probe RNA: NEGATIVE
GC Probe RNA: NEGATIVE

## 2014-03-25 NOTE — ED Provider Notes (Signed)
Medical screening examination/treatment/procedure(s) were conducted as a shared visit with non-physician practitioner(s) and myself.  I personally evaluated the patient during the encounter.   EKG Interpretation None       Doug SouSam Jacubowitz, MD 03/25/14 [phone]

## 2014-05-12 ENCOUNTER — Ambulatory Visit: Payer: Medicaid Other | Attending: Specialist

## 2014-05-12 DIAGNOSIS — M25569 Pain in unspecified knee: Secondary | ICD-10-CM | POA: Insufficient documentation

## 2014-05-12 DIAGNOSIS — S83429A Sprain of lateral collateral ligament of unspecified knee, initial encounter: Secondary | ICD-10-CM | POA: Diagnosis not present

## 2014-05-12 DIAGNOSIS — W108XXA Fall (on) (from) other stairs and steps, initial encounter: Secondary | ICD-10-CM | POA: Diagnosis not present

## 2014-05-12 DIAGNOSIS — IMO0001 Reserved for inherently not codable concepts without codable children: Secondary | ICD-10-CM | POA: Insufficient documentation

## 2014-06-28 ENCOUNTER — Ambulatory Visit: Payer: Medicaid Other | Admitting: Obstetrics

## 2014-08-17 ENCOUNTER — Ambulatory Visit: Payer: Medicaid Other | Admitting: Obstetrics

## 2014-09-04 ENCOUNTER — Ambulatory Visit (INDEPENDENT_AMBULATORY_CARE_PROVIDER_SITE_OTHER): Payer: Medicaid Other | Admitting: Obstetrics

## 2014-09-04 ENCOUNTER — Encounter: Payer: Self-pay | Admitting: Obstetrics

## 2014-09-04 VITALS — BP 151/85 | HR 74 | Temp 98.3°F | Ht 66.0 in | Wt 284.0 lb

## 2014-09-04 DIAGNOSIS — R87612 Low grade squamous intraepithelial lesion on cytologic smear of cervix (LGSIL): Secondary | ICD-10-CM

## 2014-09-04 DIAGNOSIS — N912 Amenorrhea, unspecified: Secondary | ICD-10-CM

## 2014-09-04 DIAGNOSIS — A499 Bacterial infection, unspecified: Secondary | ICD-10-CM

## 2014-09-04 DIAGNOSIS — E669 Obesity, unspecified: Secondary | ICD-10-CM

## 2014-09-04 DIAGNOSIS — L989 Disorder of the skin and subcutaneous tissue, unspecified: Secondary | ICD-10-CM

## 2014-09-04 DIAGNOSIS — B9689 Other specified bacterial agents as the cause of diseases classified elsewhere: Secondary | ICD-10-CM

## 2014-09-04 DIAGNOSIS — N760 Acute vaginitis: Secondary | ICD-10-CM

## 2014-09-04 LAB — COMPREHENSIVE METABOLIC PANEL
ALT: 21 U/L (ref 0–35)
AST: 18 U/L (ref 0–37)
Albumin: 4.2 g/dL (ref 3.5–5.2)
Alkaline Phosphatase: 79 U/L (ref 39–117)
BILIRUBIN TOTAL: 0.2 mg/dL (ref 0.2–1.2)
BUN: 11 mg/dL (ref 6–23)
CALCIUM: 9.8 mg/dL (ref 8.4–10.5)
CHLORIDE: 101 meq/L (ref 96–112)
CO2: 27 meq/L (ref 19–32)
CREATININE: 0.7 mg/dL (ref 0.50–1.10)
GLUCOSE: 128 mg/dL — AB (ref 70–99)
Potassium: 4 mEq/L (ref 3.5–5.3)
Sodium: 137 mEq/L (ref 135–145)
Total Protein: 7.2 g/dL (ref 6.0–8.3)

## 2014-09-04 LAB — POCT URINE PREGNANCY: Preg Test, Ur: NEGATIVE

## 2014-09-04 LAB — CBC WITH DIFFERENTIAL/PLATELET
BASOS ABS: 0 10*3/uL (ref 0.0–0.1)
Basophils Relative: 0 % (ref 0–1)
Eosinophils Absolute: 0.3 10*3/uL (ref 0.0–0.7)
Eosinophils Relative: 3 % (ref 0–5)
HEMATOCRIT: 38.8 % (ref 36.0–46.0)
Hemoglobin: 13 g/dL (ref 12.0–15.0)
LYMPHS PCT: 29 % (ref 12–46)
Lymphs Abs: 3.3 10*3/uL (ref 0.7–4.0)
MCH: 25.7 pg — ABNORMAL LOW (ref 26.0–34.0)
MCHC: 33.5 g/dL (ref 30.0–36.0)
MCV: 76.7 fL — ABNORMAL LOW (ref 78.0–100.0)
MONO ABS: 0.6 10*3/uL (ref 0.1–1.0)
MONOS PCT: 5 % (ref 3–12)
MPV: 9.9 fL (ref 9.4–12.4)
NEUTROS ABS: 7.2 10*3/uL (ref 1.7–7.7)
NEUTROS PCT: 63 % (ref 43–77)
Platelets: 217 10*3/uL (ref 150–400)
RBC: 5.06 MIL/uL (ref 3.87–5.11)
RDW: 14.7 % (ref 11.5–15.5)
WBC: 11.5 10*3/uL — AB (ref 4.0–10.5)

## 2014-09-04 LAB — HEMOGLOBIN A1C
Hgb A1c MFr Bld: 6.4 % — ABNORMAL HIGH (ref <5.7)
MEAN PLASMA GLUCOSE: 137 mg/dL — AB (ref <117)

## 2014-09-04 LAB — TSH: TSH: 2.94 u[IU]/mL (ref 0.350–4.500)

## 2014-09-04 MED ORDER — METRONIDAZOLE 500 MG PO TABS: 500.0000 mg | ORAL_TABLET | Freq: Two times a day (BID) | ORAL | Status: DC

## 2014-09-05 ENCOUNTER — Encounter: Payer: Self-pay | Admitting: Obstetrics

## 2014-09-05 DIAGNOSIS — E669 Obesity, unspecified: Secondary | ICD-10-CM | POA: Insufficient documentation

## 2014-09-05 NOTE — Progress Notes (Signed)
Patient ID: Sherry Duke, female   DOB: 04-01-1991, 23 y.o.   MRN: [id number]  Chief Complaint  Patient presents with  . Vaginitis    HPI Sherry Duke Mccart is a 23 y.o. female.  Malodorous vaginal discharge.  HPI  Past Medical History  Diagnosis Date  . Irregular menstrual cycle   . Prediabetes     Past Surgical History  Procedure Laterality Date  . No past surgeries      Family History  Problem Relation Age of Onset  . Adopted: Yes  . Sickle cell anemia Sister     Social History History  Substance Use Topics  . Smoking status: Former Games developermoker  . Smokeless tobacco: Never Used  . Alcohol Use: No    No Known Allergies  Current Outpatient Prescriptions  Medication Sig Dispense Refill  . metroNIDAZOLE (FLAGYL) 500 MG tablet Take 1 tablet (500 mg total) by mouth 2 (two) times daily. 14 tablet 2   No current facility-administered medications for this visit.    Review of Systems Review of Systems Constitutional: negative for fatigue and weight loss Respiratory: negative for cough and wheezing Cardiovascular: negative for chest pain, fatigue and palpitations Gastrointestinal: negative for abdominal pain and change in bowel habits Genitourinary:negative Integument/breast: negative for nipple discharge Musculoskeletal:negative for myalgias Neurological: negative for gait problems and tremors Behavioral/Psych: negative for abusive relationship, depression Endocrine: negative for temperature intolerance     Blood pressure 151/85, pulse 74, temperature 98.3 F (36.8 C), height 5\' 6"  (1.676 m), weight 284 lb (128.822 kg).  Physical Exam Physical Exam General:   alert  Skin:   no rash or abnormalities  Lungs:   clear to auscultation bilaterally  Heart:   regular rate and rhythm, S1, S2 normal, no murmur, click, rub or gallop  Breasts:   normal without suspicious masses, skin or nipple changes or axillary nodes  Abdomen:  normal findings: no organomegaly, soft,  non-tender and no hernia  Pelvis:  External genitalia: normal general appearance Urinary system: urethral meatus normal and bladder without fullness, nontender Vaginal: normal without tenderness, induration or masses Cervix: normal appearance Adnexa: normal bimanual exam Uterus: anteverted and non-tender, normal size      Data Reviewed Labs  Assessment    BV     Plan    Flagyl Rx F/U prn  Orders Placed This Encounter  Procedures  . SureSwab, Vaginosis/Vaginitis Plus  . Comprehensive metabolic panel  . CBC with Differential  . TSH  . HgB A1c  . Ambulatory referral to Podiatry    Referral Priority:  Routine    Referral Type:  Consultation    Referral Reason:  Specialty Services Required    Requested Specialty:  Podiatry    Number of Visits Requested:  1  . POCT urine pregnancy   Meds ordered this encounter  Medications  . metroNIDAZOLE (FLAGYL) 500 MG tablet    Sig: Take 1 tablet (500 mg total) by mouth 2 (two) times daily.    Dispense:  14 tablet    Refill:  2       HARPER,CHARLES A 09/05/2014, 1:06 PM

## 2014-09-06 ENCOUNTER — Telehealth: Payer: Self-pay

## 2014-09-06 ENCOUNTER — Telehealth: Payer: Self-pay | Admitting: *Deleted

## 2014-09-06 NOTE — Telephone Encounter (Signed)
patient has appt with [facility] on 12/16 at 1:45pm - left her message today regarding appt.

## 2014-09-06 NOTE — Telephone Encounter (Signed)
[facility] said they would call patient today for referral appt - they have been short staffed, but promised they would call patient today - let patient know

## 2014-09-06 NOTE — Telephone Encounter (Signed)
Patient called requesting lab results.  Attempted to contact the patient and left message for patient to contact the office.

## 2014-09-07 NOTE — Telephone Encounter (Signed)
Patient notified of results per Foye ClockKristina

## 2014-09-08 ENCOUNTER — Other Ambulatory Visit: Payer: Self-pay | Admitting: *Deleted

## 2014-09-08 MED ORDER — FLUCONAZOLE 150 MG PO TABS: 150.0000 mg | ORAL_TABLET | Freq: Once | ORAL | Status: DC

## 2014-09-13 ENCOUNTER — Ambulatory Visit (INDEPENDENT_AMBULATORY_CARE_PROVIDER_SITE_OTHER): Payer: Medicaid Other | Admitting: Podiatry

## 2014-09-13 ENCOUNTER — Ambulatory Visit: Payer: Medicaid Other | Admitting: Podiatry

## 2014-09-13 VITALS — BP 132/86 | HR 87 | Resp 16

## 2014-09-13 DIAGNOSIS — L84 Corns and callosities: Secondary | ICD-10-CM

## 2014-09-13 MED ORDER — UREA 39 % EX CREA: 1.0000 "application " | TOPICAL_CREAM | Freq: Every day | CUTANEOUS | Status: DC

## 2014-09-17 NOTE — Progress Notes (Signed)
Patient ID: Sherry Duke, female   DOB: 1990/10/10, 23 y.o.   MRN: [id number]  Subjective: 23 year old female presents the office today with complaints of thick painful calluses to the bottom of both of her feet. She states of the right foot is worse than the left. She states that she has had thick painful calluses the majority of her life and she can remove the right side being thick since she was in elementary school. She states that she previously has been seen by podiatrist where they have been debrided. She also states that she goes [facility] however they are not able to trim them enough. She states that she has never seen a dermatologist or any other specialties for this issue. No other complaints at this time.  Objective: AAO 3, NAD DP/PT pulses palpable bilaterally, CRT less than 3 seconds Protective sensation intact with Simms Weinstein monofilament, vibratory sensation intact, Achilles tendon reflex intact. Thick hyperkeratotic lesions bilateral plantar feet along the midfoot. There is no surrounding erythema, edema, increase in warmth. Upon debridement of the lesions no underlying open lesions. There is fissuring along the proximal aspect of the midfoot distal to the heel. No bleeding identified. No areas of pinpoint bony tenderness or pain with vibratory sensation. MMT 5/5, ROM WNL No pain with calf compression, swelling, warmth, erythema. No open lesions identified.  Assessment: 23 year old female with thick hyperkeratotic lesions bilateral plantar feet right greater than left.  Plan: -Treatment options were discussed with the patient including alternatives, risks, complications. -Hyperkeratotic lesions bilateral plantar feet were sharply debrided without complications. After the debridement, I left the room after all questions were answered and the plan was discussed. She then started yelling after I left the room that she wanted more of the tissue removed. She  apparently took the scalpel and started trimming the area herself. When I went to the room I discussed this with her and she said "I don't care if I bleed, I just want this area gone". I further discussed this with her and I debrided more of the callus without any bleeding to patient comfort.  -Discussed the patient to use a pumice stone to help keep the area smooth when she gets out of the shower and then to apply urea cream which I prescribed. -I also recommended follow-up with a dermatologist, however she did not want to at this time.  -Follow-up as needed. In the meantime, call the office with any questions, concerns, change in symptoms.

## 2014-09-24 ENCOUNTER — Encounter (HOSPITAL_COMMUNITY): Payer: Self-pay

## 2014-09-24 ENCOUNTER — Emergency Department (HOSPITAL_COMMUNITY)
Admission: EM | Admit: 2014-09-24 | Discharge: 2014-09-24 | Disposition: A | Discharge status: Home / Self Care | Payer: Medicaid Other | Attending: Emergency Medicine | Admitting: Emergency Medicine

## 2014-09-24 DIAGNOSIS — S8991XA Unspecified injury of right lower leg, initial encounter: Secondary | ICD-10-CM | POA: Insufficient documentation

## 2014-09-24 DIAGNOSIS — S3992XA Unspecified injury of lower back, initial encounter: Secondary | ICD-10-CM | POA: Diagnosis not present

## 2014-09-24 DIAGNOSIS — Y998 Other external cause status: Secondary | ICD-10-CM | POA: Diagnosis not present

## 2014-09-24 DIAGNOSIS — S8992XA Unspecified injury of left lower leg, initial encounter: Secondary | ICD-10-CM | POA: Diagnosis not present

## 2014-09-24 DIAGNOSIS — Y92410 Unspecified street and highway as the place of occurrence of the external cause: Secondary | ICD-10-CM | POA: Diagnosis not present

## 2014-09-24 DIAGNOSIS — Z87891 Personal history of nicotine dependence: Secondary | ICD-10-CM | POA: Insufficient documentation

## 2014-09-24 DIAGNOSIS — Z8742 Personal history of other diseases of the female genital tract: Secondary | ICD-10-CM | POA: Insufficient documentation

## 2014-09-24 DIAGNOSIS — S4991XA Unspecified injury of right shoulder and upper arm, initial encounter: Secondary | ICD-10-CM | POA: Insufficient documentation

## 2014-09-24 DIAGNOSIS — Y9389 Activity, other specified: Secondary | ICD-10-CM | POA: Insufficient documentation

## 2014-09-24 DIAGNOSIS — S199XXA Unspecified injury of neck, initial encounter: Secondary | ICD-10-CM | POA: Insufficient documentation

## 2014-09-24 DIAGNOSIS — S299XXA Unspecified injury of thorax, initial encounter: Secondary | ICD-10-CM | POA: Insufficient documentation

## 2014-09-24 DIAGNOSIS — S0990XA Unspecified injury of head, initial encounter: Secondary | ICD-10-CM | POA: Diagnosis present

## 2014-09-24 DIAGNOSIS — Z79899 Other long term (current) drug therapy: Secondary | ICD-10-CM | POA: Insufficient documentation

## 2014-09-24 DIAGNOSIS — S29001A Unspecified injury of muscle and tendon of front wall of thorax, initial encounter: Secondary | ICD-10-CM | POA: Diagnosis not present

## 2014-09-24 MED ORDER — CYCLOBENZAPRINE HCL 10 MG PO TABS: 10.0000 mg | ORAL_TABLET | Freq: Two times a day (BID) | ORAL | Status: DC | PRN

## 2014-09-24 MED ORDER — CYCLOBENZAPRINE HCL 10 MG PO TABS
5.0000 mg | ORAL_TABLET | Freq: Once | ORAL | Status: AC
  Administered 2014-09-24: 5 mg via ORAL
  Filled 2014-09-24: qty 1

## 2014-09-24 MED ORDER — IBUPROFEN 800 MG PO TABS
800.0000 mg | ORAL_TABLET | Freq: Three times a day (TID) | ORAL | Status: DC
Start: 2014-09-24 — End: 2015-12-26

## 2014-09-24 MED ORDER — IBUPROFEN 400 MG PO TABS
800.0000 mg | ORAL_TABLET | Freq: Once | ORAL | Status: AC
  Administered 2014-09-24: 800 mg via ORAL
  Filled 2014-09-24: qty 2

## 2014-09-24 MED ORDER — TRAMADOL HCL 50 MG PO TABS
50.0000 mg | ORAL_TABLET | Freq: Four times a day (QID) | ORAL | Status: DC | PRN
Start: 2014-09-24 — End: 2015-12-26
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
## 2014-09-29 HISTORY — PX: KNEE ARTHROSCOPY W/ ACL RECONSTRUCTION: SHX1858

## 2014-11-20 ENCOUNTER — Ambulatory Visit: Payer: Medicaid Other | Admitting: Obstetrics

## 2015-02-22 IMAGING — CR DG KNEE COMPLETE 4+V*L*
4 series · 4 of 4 positions shown · non-contrast
Comparison: None.

CLINICAL DATA: Post fall (03/20/2014) now with generalized knee
pain.

EXAM:
LEFT KNEE - COMPLETE 4+ VIEW

[t knee ap left]
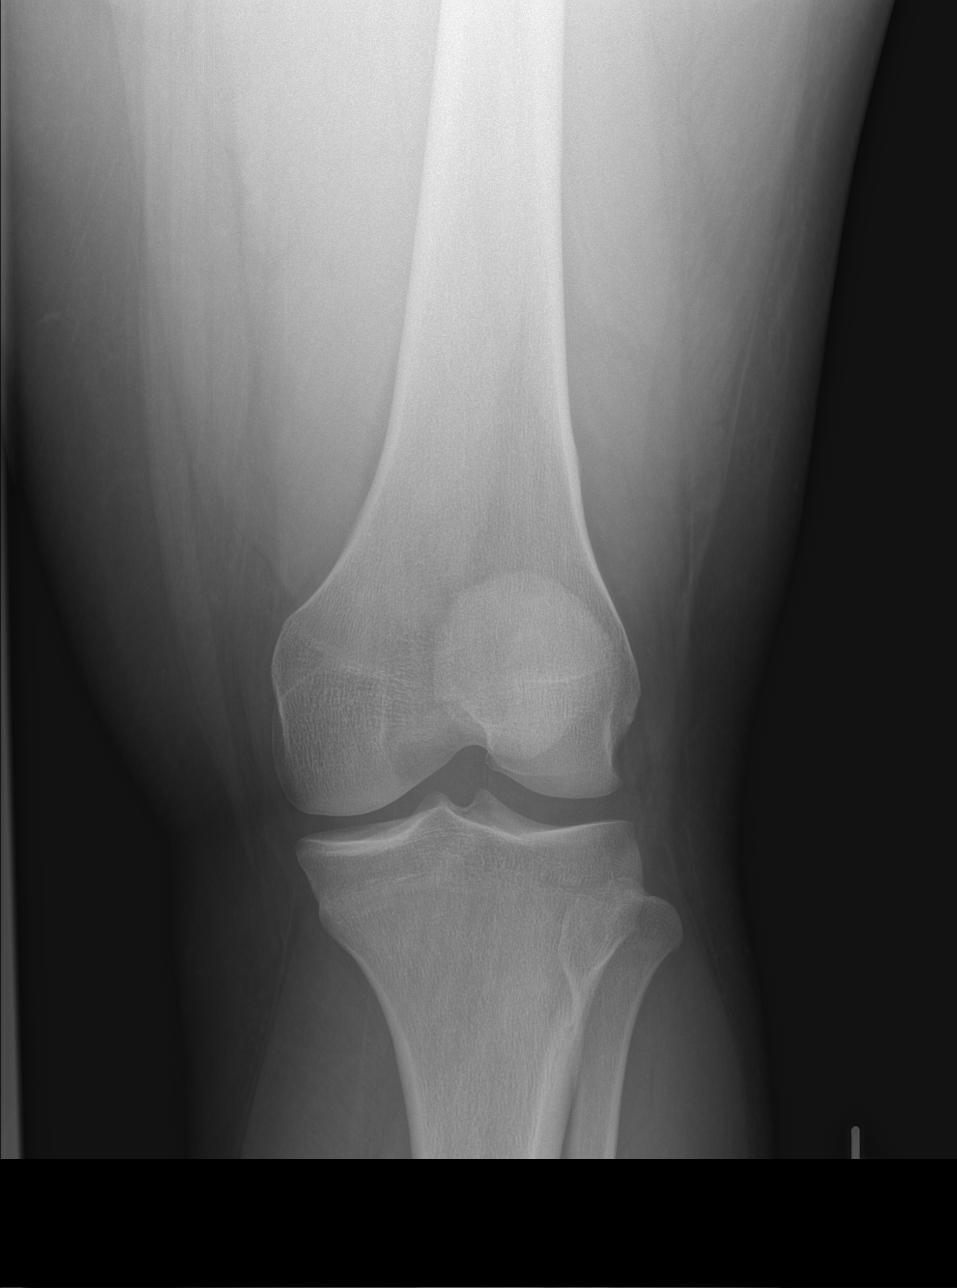

[t knee obl left (1 of 2)]
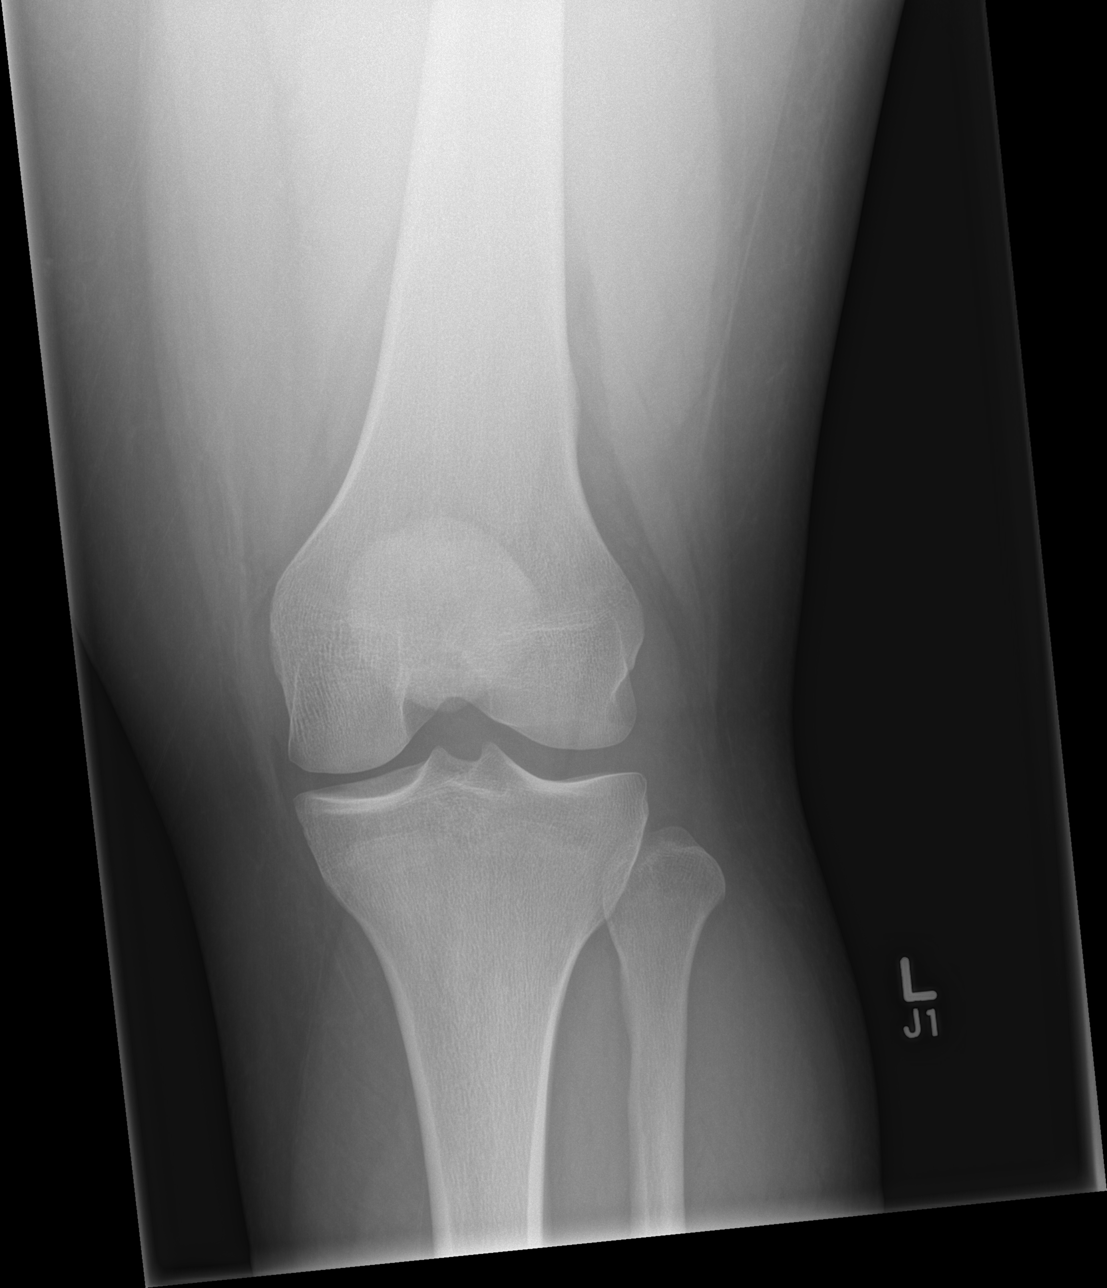

[t knee obl left (2 of 2)]
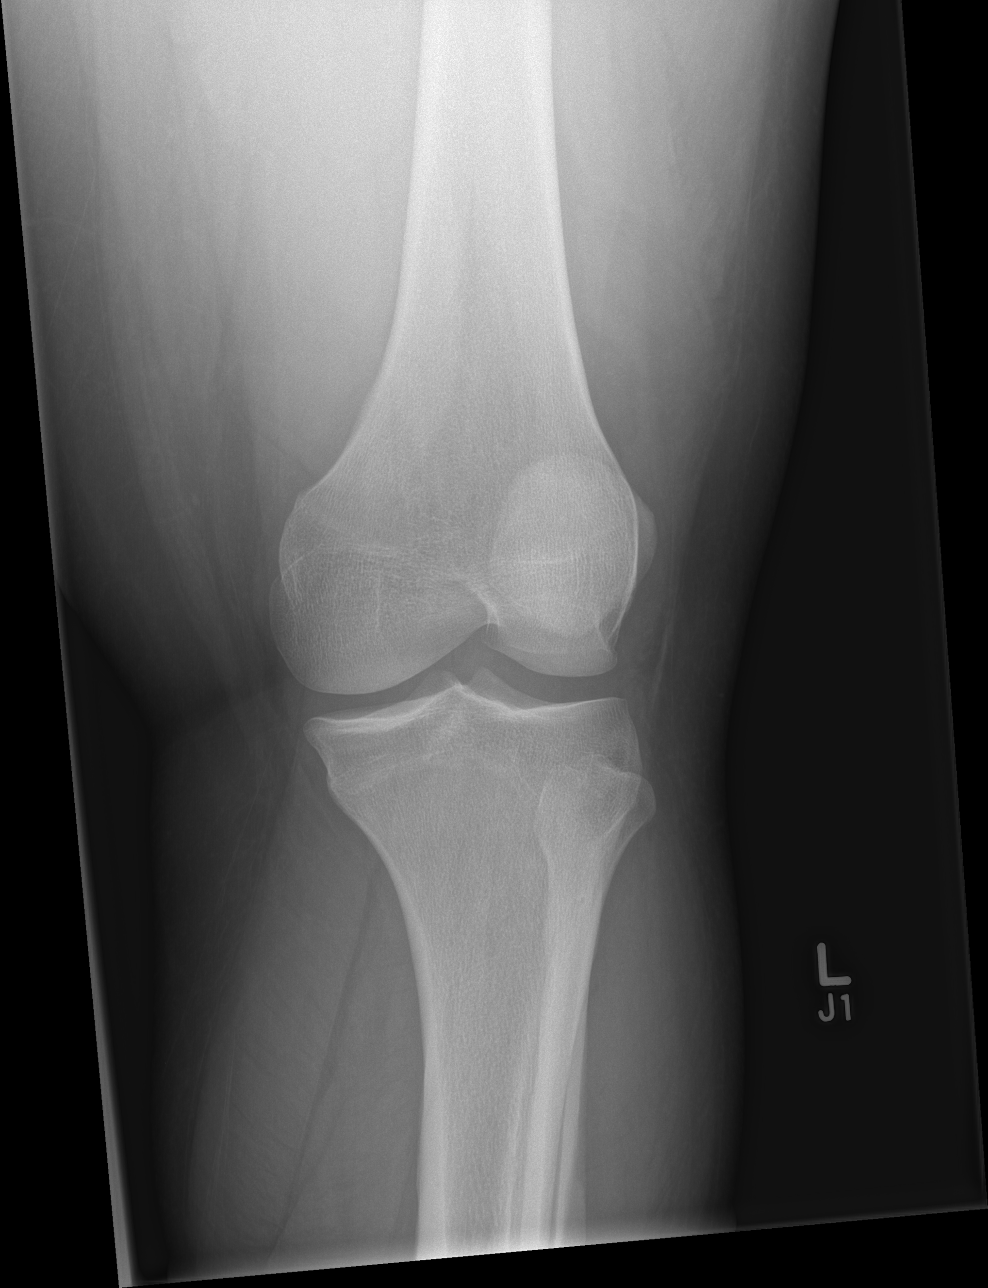

[t knee lat left]
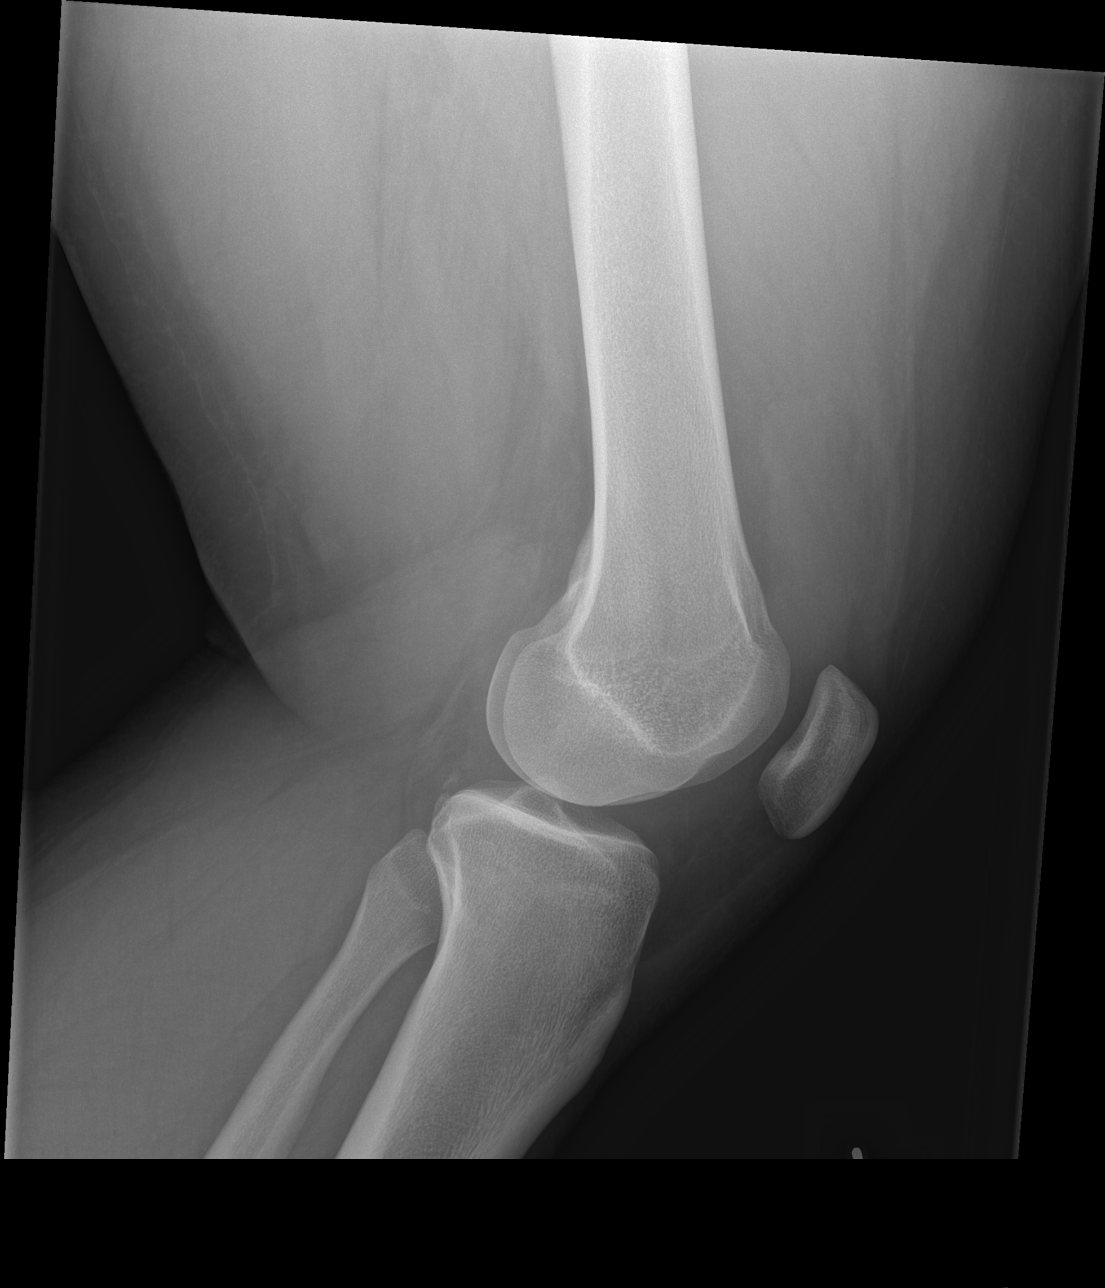

[4 of 4 positions shown; findings below may reference images not displayed]

FINDINGS: Small to moderate-sized joint effusion. No fracture or dislocation.
No evidence of lipohemarthrosis. Joint spaces are preserved. No
evidence of chondrocalcinosis. Regional soft tissues appear normal.
No radiopaque foreign body.
IMPRESSION: Small to moderate-sized joint effusion. Otherwise, no acute
findings.

## 2015-11-05 ENCOUNTER — Ambulatory Visit: Payer: Medicaid Other | Admitting: Obstetrics

## 2015-12-25 ENCOUNTER — Ambulatory Visit: Payer: Medicaid Other | Admitting: Obstetrics

## 2015-12-26 ENCOUNTER — Encounter: Payer: Self-pay | Admitting: Obstetrics

## 2015-12-26 ENCOUNTER — Ambulatory Visit (INDEPENDENT_AMBULATORY_CARE_PROVIDER_SITE_OTHER): Payer: Medicaid Other | Admitting: Obstetrics

## 2015-12-26 VITALS — BP 136/83 | HR 77 | Temp 98.4°F | Wt 273.0 lb

## 2015-12-26 DIAGNOSIS — Z3202 Encounter for pregnancy test, result negative: Secondary | ICD-10-CM | POA: Diagnosis not present

## 2015-12-26 DIAGNOSIS — R896 Abnormal cytological findings in specimens from other organs, systems and tissues: Secondary | ICD-10-CM | POA: Diagnosis not present

## 2015-12-26 DIAGNOSIS — Z113 Encounter for screening for infections with a predominantly sexual mode of transmission: Secondary | ICD-10-CM | POA: Diagnosis not present

## 2015-12-26 DIAGNOSIS — B3731 Acute candidiasis of vulva and vagina: Secondary | ICD-10-CM

## 2015-12-26 DIAGNOSIS — A499 Bacterial infection, unspecified: Secondary | ICD-10-CM | POA: Diagnosis not present

## 2015-12-26 DIAGNOSIS — B373 Candidiasis of vulva and vagina: Secondary | ICD-10-CM

## 2015-12-26 DIAGNOSIS — L0232 Furuncle of buttock: Secondary | ICD-10-CM | POA: Diagnosis not present

## 2015-12-26 DIAGNOSIS — IMO0002 Reserved for concepts with insufficient information to code with codable children: Secondary | ICD-10-CM

## 2015-12-26 DIAGNOSIS — B9689 Other specified bacterial agents as the cause of diseases classified elsewhere: Secondary | ICD-10-CM

## 2015-12-26 DIAGNOSIS — E669 Obesity, unspecified: Secondary | ICD-10-CM | POA: Diagnosis not present

## 2015-12-26 DIAGNOSIS — N760 Acute vaginitis: Secondary | ICD-10-CM | POA: Diagnosis not present

## 2015-12-26 LAB — POCT URINALYSIS DIPSTICK
Bilirubin, UA: NEGATIVE
Glucose, UA: NEGATIVE
KETONES UA: NEGATIVE
LEUKOCYTES UA: NEGATIVE
Nitrite, UA: NEGATIVE
PH UA: 6.5
RBC UA: NEGATIVE
Spec Grav, UA: 1.02
Urobilinogen, UA: NEGATIVE

## 2015-12-26 LAB — POCT URINE PREGNANCY: Preg Test, Ur: NEGATIVE

## 2015-12-26 MED ORDER — CLINDAMYCIN HCL 300 MG PO CAPS: 300.0000 mg | ORAL_CAPSULE | Freq: Three times a day (TID) | ORAL | Status: DC

## 2015-12-26 MED ORDER — FLUCONAZOLE 150 MG PO TABS: 150.0000 mg | ORAL_TABLET | Freq: Once | ORAL | Status: DC

## 2015-12-26 NOTE — Patient Instructions (Signed)

## 2015-12-27 ENCOUNTER — Encounter: Payer: Self-pay | Admitting: Obstetrics

## 2015-12-27 LAB — RPR: RPR Ser Ql: NONREACTIVE

## 2015-12-27 LAB — HIV ANTIBODY (ROUTINE TESTING W REFLEX): HIV Screen 4th Generation wRfx: NONREACTIVE

## 2015-12-27 LAB — HEPATITIS C ANTIBODY: HEP C VIRUS AB: 0.1 {s_co_ratio} (ref 0.0–0.9)

## 2015-12-27 LAB — HEPATITIS B SURFACE ANTIGEN: Hepatitis B Surface Ag: NEGATIVE

## 2015-12-27 NOTE — Progress Notes (Signed)
Patient ID: Sherry Duke, female   DOB: 12-03-1990, 25 y.o.   MRN: [id number]  Chief Complaint  Patient presents with  . STD Check    HPI Sherry Duke is a 25 y.o. female.  Possible exposure to STD.  Malodorous vaginal discharge. HPI  Past Medical History  Diagnosis Date  . Irregular menstrual cycle   . Prediabetes     Past Surgical History  Procedure Laterality Date  . No past surgeries    . Knee arthroscopy w/ acl reconstruction Left 2016    Family History  Problem Relation Age of Onset  . Adopted: Yes  . Sickle cell anemia Sister     Social History Social History  Substance Use Topics  . Smoking status: Former Games developermoker  . Smokeless tobacco: Never Used  . Alcohol Use: No    No Known Allergies  Current Outpatient Prescriptions  Medication Sig Dispense Refill  . clindamycin (CLEOCIN) 300 MG capsule Take 1 capsule (300 mg total) by mouth 3 (three) times daily. 30 capsule 2  . fluconazole (DIFLUCAN) 150 MG tablet Take 1 tablet (150 mg total) by mouth once. 1 tablet 2   No current facility-administered medications for this visit.    Review of Systems Review of Systems Constitutional: negative for fatigue and weight loss Respiratory: negative for cough and wheezing Cardiovascular: negative for chest pain, fatigue and palpitations Gastrointestinal: negative for abdominal pain and change in bowel habits Genitourinary: positive for malodorous vaginal discharge and painful boil on inner buttock Integument/breast: negative for nipple discharge Musculoskeletal:negative for myalgias Neurological: negative for gait problems and tremors Behavioral/Psych: negative for abusive relationship, depression Endocrine: negative for temperature intolerance     Blood pressure 136/83, pulse 77, temperature 98.4 F (36.9 C), weight 273 lb (123.832 kg), last menstrual period 10/24/2015.  Physical Exam Physical Exam            General:  Alert and no distress Abdomen:   normal findings: no organomegaly, soft, non-tender and no hernia  Pelvis:  External genitalia: normal general appearance.  Boil on inner buttock. Urinary system: urethral meatus normal and bladder without fullness, nontender Vaginal: normal without tenderness, induration or masses Cervix: normal appearance Adnexa: normal bimanual exam Uterus: anteverted and non-tender, normal size      Data Reviewed Labs  Assessment     STD Screening  BV Boil on buttock History of LGSIL pap    Plan    Wet prep and GC / Chlamydia cultures done. Clindamycin Rx F/U in 3 months for pap  Orders Placed This Encounter  Procedures  . HIV antibody  . Hepatitis B surface antigen  . RPR  . Hepatitis C antibody  . NuSwab Vaginitis Plus (VG+)  . POCT urinalysis dipstick  . POCT urine pregnancy   Meds ordered this encounter  Medications  . clindamycin (CLEOCIN) 300 MG capsule    Sig: Take 1 capsule (300 mg total) by mouth 3 (three) times daily.    Dispense:  30 capsule    Refill:  2  . fluconazole (DIFLUCAN) 150 MG tablet    Sig: Take 1 tablet (150 mg total) by mouth once.    Dispense:  1 tablet    Refill:  2

## 2015-12-29 ENCOUNTER — Other Ambulatory Visit: Payer: Self-pay | Admitting: Obstetrics

## 2015-12-29 LAB — NUSWAB VAGINITIS PLUS (VG+)
CANDIDA ALBICANS, NAA: NEGATIVE
Candida glabrata, NAA: NEGATIVE
Chlamydia trachomatis, NAA: NEGATIVE
Neisseria gonorrhoeae, NAA: NEGATIVE
Trich vag by NAA: NEGATIVE

## 2016-01-09 ENCOUNTER — Telehealth: Payer: Self-pay | Admitting: Obstetrics

## 2016-01-09 NOTE — Telephone Encounter (Signed)
Patient notified- all results normal

## 2016-01-09 NOTE — Telephone Encounter (Signed)
Pt wants someone to call her back with her lab results as soon as possible. Please advise

## 2016-01-31 ENCOUNTER — Ambulatory Visit: Payer: Medicaid Other | Admitting: Obstetrics

## 2016-03-27 ENCOUNTER — Ambulatory Visit: Payer: Medicaid Other | Admitting: Obstetrics

## 2016-05-08 ENCOUNTER — Encounter (HOSPITAL_COMMUNITY): Payer: Self-pay | Admitting: Emergency Medicine

## 2016-05-08 ENCOUNTER — Ambulatory Visit (HOSPITAL_COMMUNITY)
Admission: EM | Admit: 2016-05-08 | Discharge: 2016-05-08 | Discharge status: Against Medical Advice | Payer: Medicaid Other

## 2016-05-08 NOTE — ED Triage Notes (Signed)
[facility] a [facility] of left leg pain and tingling to her foot. The patient stated the pain started today while she was sitting in a chair. The patient stated that she had an ACL repair in that leg and was concerned about a DVT.

## 2016-10-22 ENCOUNTER — Encounter: Payer: Self-pay | Admitting: Obstetrics

## 2016-10-22 ENCOUNTER — Other Ambulatory Visit (HOSPITAL_COMMUNITY)
Admission: RE | Admit: 2016-10-22 | Discharge: 2016-10-22 | Disposition: A | Discharge status: Home / Self Care | Payer: Medicaid Other | Source: Ambulatory Visit | Attending: Obstetrics | Admitting: Obstetrics

## 2016-10-22 ENCOUNTER — Ambulatory Visit (INDEPENDENT_AMBULATORY_CARE_PROVIDER_SITE_OTHER): Payer: Medicaid Other | Admitting: Obstetrics

## 2016-10-22 VITALS — BP 154/92 | HR 86 | Ht 66.0 in | Wt 272.0 lb

## 2016-10-22 DIAGNOSIS — IMO0001 Reserved for inherently not codable concepts without codable children: Secondary | ICD-10-CM

## 2016-10-22 DIAGNOSIS — Z0000 Encounter for general adult medical examination without abnormal findings: Secondary | ICD-10-CM

## 2016-10-22 DIAGNOSIS — I10 Essential (primary) hypertension: Secondary | ICD-10-CM

## 2016-10-22 DIAGNOSIS — Z113 Encounter for screening for infections with a predominantly sexual mode of transmission: Secondary | ICD-10-CM | POA: Insufficient documentation

## 2016-10-22 DIAGNOSIS — Z01419 Encounter for gynecological examination (general) (routine) without abnormal findings: Secondary | ICD-10-CM

## 2016-10-22 DIAGNOSIS — S83512S Sprain of anterior cruciate ligament of left knee, sequela: Secondary | ICD-10-CM

## 2016-10-22 LAB — POCT URINE PREGNANCY: PREG TEST UR: NEGATIVE

## 2016-10-22 MED ORDER — VITAFOL ULTRA 29-0.6-0.4-200 MG PO CAPS: 1.0000 | ORAL_CAPSULE | Freq: Every day | ORAL | 11 refills | Status: DC

## 2016-10-22 NOTE — Progress Notes (Signed)
Subjective:        Sherry Duke is a 26 y.o. female here for a routine exam.  Current complaints: Aggravation of ACL tear.    Personal health questionnaire:  Is patient Sherry Duke, have a family history of breast and/or ovarian cancer: no Is there a family history of uterine cancer diagnosed at age < 3, gastrointestinal cancer, urinary tract cancer, family member who is a Personnel officer syndrome-associated carrier: no Is the patient overweight and hypertensive, family history of diabetes, personal history of gestational diabetes, preeclampsia or PCOS: no Is patient over 51, have PCOS,  family history of premature CHD under age 91, diabetes, smoke, have hypertension or peripheral artery disease:  no At any time, has a partner hit, kicked or otherwise hurt or frightened you?: no Over the past 2 weeks, have you felt down, depressed or hopeless?: no Over the past 2 weeks, have you felt little interest or pleasure in doing things?:no   Gynecologic History Patient's last menstrual period was 09/20/2016 (within days). Contraception: none Last Pap: 2015. Results were: abnormal ( LGSIL ) Last mammogram: n/a. Results were: n/a  Obstetric History OB History  Gravida Para Term Preterm AB Living  0 0 0 0 0 0  SAB TAB Ectopic Multiple Live Births  0 0 0 0          Past Medical History:  Diagnosis Date  . Irregular menstrual cycle   . Prediabetes     Past Surgical History:  Procedure Laterality Date  . KNEE ARTHROSCOPY W/ ACL RECONSTRUCTION Left 2016  . NO PAST SURGERIES       Current Outpatient Prescriptions:  .  clindamycin (CLEOCIN) 300 MG capsule, Take 1 capsule (300 mg total) by mouth 3 (three) times daily. (Patient not taking: Reported on 10/22/2016), Disp: 30 capsule, Rfl: 2 .  fluconazole (DIFLUCAN) 150 MG tablet, Take 1 tablet (150 mg total) by mouth once. (Patient not taking: Reported on 10/22/2016), Disp: 1 tablet, Rfl: 2 .  Prenat-Fe Poly-Methfol-FA-DHA (VITAFOL  ULTRA) 29-0.6-0.4-200 MG CAPS, Take 1 capsule by mouth daily before breakfast., Disp: 30 capsule, Rfl: 11 No Known Allergies  Social History  Substance Use Topics  . Smoking status: Current Every Day Smoker  . Smokeless tobacco: Never Used     Comment: Marijuana  . Alcohol use No    Family History  Problem Relation Age of Onset  . Adopted: Yes  . Sickle cell anemia Sister       Review of Systems  Constitutional: negative for fatigue and weight loss Respiratory: negative for cough and wheezing Cardiovascular: negative for chest pain, fatigue and palpitations Gastrointestinal: negative for abdominal pain and change in bowel habits Musculoskeletal:unstable left knee Neurological: negative for gait problems and tremors Behavioral/Psych: negative for abusive relationship, depression Endocrine: negative for temperature intolerance    Genitourinary:negative for abnormal menstrual periods, genital lesions, hot flashes, sexual problems and vaginal discharge Integument/breast: negative for breast lump, breast tenderness, nipple discharge and skin lesion(s)    Objective:       BP (!) 154/92   Pulse 86   Ht 5\' 6"  (1.676 m)   Wt 272 lb (123.4 kg)   LMP 09/20/2016 (Within Days)   BMI 43.90 kg/m  General:   alert  Skin:   no rash or abnormalities  Lungs:   clear to auscultation bilaterally  Heart:   regular rate and rhythm, S1, S2 normal, no murmur, click, rub or gallop  Breasts:   normal without suspicious masses, skin or  nipple changes or axillary nodes  Abdomen:  normal findings: no organomegaly, soft, non-tender and no hernia  Pelvis:  External genitalia: normal general appearance Urinary system: urethral meatus normal and bladder without fullness, nontender Vaginal: normal without tenderness, induration or masses Cervix: normal appearance Adnexa: normal bimanual exam Uterus: anteverted and non-tender, normal size   Lab Review Urine pregnancy test Labs reviewed  yes Radiologic studies reviewed no  50% of 20 min visit spent on counseling and coordination of care.    Assessment:    Healthy female exam.   HTN  Obesity ACL tear   Plan:    Follow up 1 year  Referred to Internal Medicine  Referred to Orthopedic Surgery    Meds ordered this encounter  Medications  . Prenat-Fe Poly-Methfol-FA-DHA (VITAFOL ULTRA) 29-0.6-0.4-200 MG CAPS    Sig: Take 1 capsule by mouth daily before breakfast.    Dispense:  30 capsule    Refill:  11   Orders Placed This Encounter  Procedures  . Hepatitis B surface antigen  . Hepatitis C antibody  . HIV antibody  . RPR  . HgB A1c  . CBC with Differential/Platelet  . Comprehensive metabolic panel  . TSH  . Ambulatory referral to Orthopedic Surgery    Referral Priority:   Routine    Referral Type:   Surgical    Referral Reason:   Specialty Services Required    Requested Specialty:   Orthopedic Surgery    Number of Visits Requested:   1  . Ambulatory referral to Internal Medicine    Referral Priority:   Routine    Referral Type:   Consultation    Referral Reason:   Specialty Services Required    Requested Specialty:   Internal Medicine    Number of Visits Requested:   1  . POCT urine pregnancy      Patient ID: Sherry Duke, female   DOB: 10-07-1990, 26 y.o.   MRN: [id number]

## 2016-10-22 NOTE — Progress Notes (Signed)
Pt presents for Annual, pap smear, and STD testing. She wants UPT and requesta blood work to determine whether or not she has diabetes. Pt is currently fasting. She does not currently have a PCP. Recommendations given.

## 2016-10-23 LAB — CYTOLOGY - PAP: Diagnosis: NEGATIVE

## 2016-10-23 LAB — CBC WITH DIFFERENTIAL/PLATELET
Basophils Absolute: 0 10*3/uL (ref 0.0–0.2)
Basos: 0 %
EOS (ABSOLUTE): 0.3 10*3/uL (ref 0.0–0.4)
Eos: 3 %
Hematocrit: 40.8 % (ref 34.0–46.6)
Hemoglobin: 13.5 g/dL (ref 11.1–15.9)
IMMATURE GRANULOCYTES: 0 %
Immature Grans (Abs): 0 10*3/uL (ref 0.0–0.1)
Lymphocytes Absolute: 4 10*3/uL — ABNORMAL HIGH (ref 0.7–3.1)
Lymphs: 35 %
MCH: 26 pg — ABNORMAL LOW (ref 26.6–33.0)
MCHC: 33.1 g/dL (ref 31.5–35.7)
MCV: 79 fL (ref 79–97)
MONOS ABS: 0.7 10*3/uL (ref 0.1–0.9)
Monocytes: 6 %
Neutrophils Absolute: 6.4 10*3/uL (ref 1.4–7.0)
Neutrophils: 56 %
Platelets: 245 10*3/uL (ref 150–379)
RBC: 5.2 x10E6/uL (ref 3.77–5.28)
RDW: 13.7 % (ref 12.3–15.4)
WBC: 11.5 10*3/uL — AB (ref 3.4–10.8)

## 2016-10-23 LAB — CERVICOVAGINAL ANCILLARY ONLY
Bacterial vaginitis: POSITIVE — AB
CHLAMYDIA, DNA PROBE: NEGATIVE
Candida vaginitis: NEGATIVE
NEISSERIA GONORRHEA: NEGATIVE
Trichomonas: NEGATIVE

## 2016-10-23 LAB — COMPREHENSIVE METABOLIC PANEL
A/G RATIO: 1.4 (ref 1.2–2.2)
ALT: 14 IU/L (ref 0–32)
AST: 16 IU/L (ref 0–40)
Albumin: 4.3 g/dL (ref 3.5–5.5)
Alkaline Phosphatase: 85 IU/L (ref 39–117)
BUN/Creatinine Ratio: 8 — ABNORMAL LOW (ref 9–23)
BUN: 7 mg/dL (ref 6–20)
Bilirubin Total: <0.2 mg/dL (ref 0.0–1.2)
CALCIUM: 9.6 mg/dL (ref 8.7–10.2)
CO2: 22 mmol/L (ref 18–29)
Chloride: 98 mmol/L (ref 96–106)
Creatinine, Ser: 0.93 mg/dL (ref 0.57–1.00)
GFR calc Af Amer: 98 mL/min/{1.73_m2} (ref >59)
GFR, EST NON AFRICAN AMERICAN: 85 mL/min/{1.73_m2} (ref >59)
Globulin, Total: 3 g/dL (ref 1.5–4.5)
Glucose: 97 mg/dL (ref 65–99)
POTASSIUM: 4.4 mmol/L (ref 3.5–5.2)
Sodium: 142 mmol/L (ref 134–144)
Total Protein: 7.3 g/dL (ref 6.0–8.5)

## 2016-10-23 LAB — HIV ANTIBODY (ROUTINE TESTING W REFLEX): HIV Screen 4th Generation wRfx: NONREACTIVE

## 2016-10-23 LAB — HEPATITIS C ANTIBODY: Hep C Virus Ab: <0.1 s/co ratio (ref 0.0–0.9)

## 2016-10-23 LAB — RPR: RPR Ser Ql: NONREACTIVE

## 2016-10-23 LAB — TSH: TSH: 1.43 u[IU]/mL (ref 0.450–4.500)

## 2016-10-23 LAB — HEPATITIS B SURFACE ANTIGEN: Hepatitis B Surface Ag: NEGATIVE

## 2016-10-23 LAB — HEMOGLOBIN A1C
ESTIMATED AVERAGE GLUCOSE: 128 mg/dL
Hgb A1c MFr Bld: 6.1 % — ABNORMAL HIGH (ref 4.8–5.6)

## 2016-10-24 ENCOUNTER — Other Ambulatory Visit: Payer: Self-pay | Admitting: Obstetrics

## 2016-10-24 DIAGNOSIS — L0232 Furuncle of buttock: Secondary | ICD-10-CM

## 2016-10-24 DIAGNOSIS — N760 Acute vaginitis: Principal | ICD-10-CM

## 2016-10-24 DIAGNOSIS — B9689 Other specified bacterial agents as the cause of diseases classified elsewhere: Secondary | ICD-10-CM

## 2016-10-24 MED ORDER — CLINDAMYCIN HCL 300 MG PO CAPS: 300.0000 mg | ORAL_CAPSULE | Freq: Three times a day (TID) | ORAL | 2 refills | Status: DC

## 2016-10-28 ENCOUNTER — Telehealth: Payer: Self-pay | Admitting: *Deleted

## 2016-10-28 NOTE — Telephone Encounter (Signed)
Pt called to office for lab results. Labs reviewed with pt and made aware Rx was sent by provider. Pt asked about referrals from time of visit.  Call was transferred to referral coordinator.

## 2017-02-11 ENCOUNTER — Emergency Department (HOSPITAL_COMMUNITY): Payer: Medicaid Other

## 2017-02-11 ENCOUNTER — Encounter (HOSPITAL_COMMUNITY): Payer: Self-pay | Admitting: Emergency Medicine

## 2017-02-11 ENCOUNTER — Emergency Department (HOSPITAL_COMMUNITY)
Admission: EM | Admit: 2017-02-11 | Discharge: 2017-02-11 | Disposition: A | Discharge status: Home / Self Care | Payer: Medicaid Other | Attending: Emergency Medicine | Admitting: Emergency Medicine

## 2017-02-11 DIAGNOSIS — Z87891 Personal history of nicotine dependence: Secondary | ICD-10-CM | POA: Diagnosis not present

## 2017-02-11 DIAGNOSIS — G44209 Tension-type headache, unspecified, not intractable: Secondary | ICD-10-CM | POA: Diagnosis not present

## 2017-02-11 DIAGNOSIS — R51 Headache: Secondary | ICD-10-CM | POA: Diagnosis present

## 2017-02-11 DIAGNOSIS — R42 Dizziness and giddiness: Secondary | ICD-10-CM | POA: Diagnosis not present

## 2017-02-11 LAB — BASIC METABOLIC PANEL
ANION GAP: 12 (ref 5–15)
BUN: 8 mg/dL (ref 6–20)
CALCIUM: 9.6 mg/dL (ref 8.9–10.3)
CO2: 24 mmol/L (ref 22–32)
CREATININE: 0.94 mg/dL (ref 0.44–1.00)
Chloride: 101 mmol/L (ref 101–111)
GFR calc non Af Amer: >60 mL/min (ref >60)
Glucose, Bld: 132 mg/dL — ABNORMAL HIGH (ref 65–99)
Potassium: 3.7 mmol/L (ref 3.5–5.1)
SODIUM: 137 mmol/L (ref 135–145)

## 2017-02-11 LAB — CBC
HCT: 40.1 % (ref 36.0–46.0)
HEMOGLOBIN: 13.2 g/dL (ref 12.0–15.0)
MCH: 26.4 pg (ref 26.0–34.0)
MCHC: 32.9 g/dL (ref 30.0–36.0)
MCV: 80.2 fL (ref 78.0–100.0)
PLATELETS: 256 10*3/uL (ref 150–400)
RBC: 5 MIL/uL (ref 3.87–5.11)
RDW: 13.8 % (ref 11.5–15.5)
WBC: 14.7 10*3/uL — AB (ref 4.0–10.5)

## 2017-02-11 LAB — I-STAT TROPONIN, ED: TROPONIN I, POC: 0 ng/mL (ref 0.00–0.08)

## 2017-02-11 LAB — I-STAT BETA HCG BLOOD, ED (MC, WL, AP ONLY)

## 2017-02-11 MED ORDER — DIPHENHYDRAMINE HCL 50 MG/ML IJ SOLN
25.0000 mg | Freq: Once | INTRAMUSCULAR | Status: AC
  Administered 2017-02-11: 25 mg via INTRAVENOUS
  Filled 2017-02-11: qty 1

## 2017-02-11 MED ORDER — PROCHLORPERAZINE EDISYLATE 5 MG/ML IJ SOLN
10.0000 mg | Freq: Once | INTRAMUSCULAR | Status: AC
  Administered 2017-02-11: 10 mg via INTRAVENOUS
  Filled 2017-02-11: qty 2

## 2017-02-11 MED ORDER — SODIUM CHLORIDE 0.9 % IV BOLUS (SEPSIS)
1000.0000 mL | Freq: Once | INTRAVENOUS | Status: AC
  Administered 2017-02-11: 1000 mL via INTRAVENOUS

## 2017-02-11 MED ORDER — KETOROLAC TROMETHAMINE 30 MG/ML IJ SOLN
30.0000 mg | Freq: Once | INTRAMUSCULAR | Status: AC
  Administered 2017-02-11: 30 mg via INTRAVENOUS
  Filled 2017-02-11: qty 1

## 2017-02-11 NOTE — ED Provider Notes (Signed)
[facility] Provider Note   CSN: [id number] Arrival date & time: 02/11/17  1324     History   Chief Complaint Chief Complaint  Patient presents with  . Dizziness  . Eye Problem  . Headache  . Numbness  . Chest Pain  . Shortness of Breath    HPI Sherry Duke is a 26 y.o. female.  HPI  This is a 5 rolled female who presents with multiple complaints. Patient reports one-week history of worsening dizziness. She describes it as both lightheadedness and room spinning. She also reports headache with photophobia. The headache is left-sided and radiates down to her neck. She reports seeing spots. She states she has a history of headaches but no known history of migraines. She has not taken anything for her symptoms. She states she was scared to go to sleep tonight. She reports occasional paresthesias in the left upper extremity. Denies weakness. Currently her headache is 8 out of 10. Denies any nausea, vomiting, diarrhea, chest pain, shortness of breath.Of note, triage nurse reported that patient complained of chest pain and shortness of breath as well. She denies that to me at this time.  Past Medical History:  Diagnosis Date  . Irregular menstrual cycle   . Prediabetes     Patient Active Problem List   Diagnosis Date Noted  . Obesity 09/05/2014  . Low grade squamous intraepithelial lesion (LGSIL) on Papanicolaou smear of cervix 09/04/2014  . BV (bacterial vaginosis) 09/04/2014  . Papanicolaou smear of cervix with low grade squamous intraepithelial lesion (LGSIL) 12/08/2013  . Pap smear of cervix with ASCUS, cannot exclude HGSIL 03/21/2013  . PCOS (polycystic ovarian syndrome) 03/21/2013  . Abnormal uterine bleeding 03/21/2013    Past Surgical History:  Procedure Laterality Date  . KNEE ARTHROSCOPY W/ ACL RECONSTRUCTION Left 2016  . NO PAST SURGERIES      OB History    Gravida Para Term Preterm AB Living   0 0 0 0 0 0   SAB TAB Ectopic Multiple Live Births     0 0 0 0         Home Medications    Prior to Admission medications   Medication Sig Start Date End Date Taking? Authorizing Provider  clindamycin (CLEOCIN) 300 MG capsule Take 1 capsule (300 mg total) by mouth 3 (three) times daily. 10/24/16   Brock Bad, MD  fluconazole (DIFLUCAN) 150 MG tablet Take 1 tablet (150 mg total) by mouth once. Patient not taking: Reported on 10/22/2016 12/26/15   Brock Bad, MD  Prenat-Fe Poly-Methfol-FA-DHA (VITAFOL ULTRA) 29-0.6-0.4-200 MG CAPS Take 1 capsule by mouth daily before breakfast. 10/22/16   Brock Bad, MD    Family History Family History  Problem Relation Age of Onset  . Adopted: Yes  . Sickle cell anemia Sister     Social History Social History  Substance Use Topics  . Smoking status: Former Games developer  . Smokeless tobacco: Never Used     Comment: Marijuana  . Alcohol use No     Allergies   Patient has no known allergies.   Review of Systems Review of Systems  Constitutional: Negative for fever.  Eyes: Positive for photophobia and visual disturbance.  Respiratory: Negative for shortness of breath.   Cardiovascular: Negative for chest pain.  Gastrointestinal: Negative for abdominal pain, nausea and vomiting.  Musculoskeletal: Negative for neck pain.  Neurological: Positive for dizziness, numbness and headaches. Negative for weakness.  All other systems reviewed and are negative.  Physical Exam Updated Vital Signs BP (!) 161/102 (BP Location: Right Arm)   Pulse 77   Temp 98.3 F (36.8 C) (Oral)   Resp 16   Ht 5\' 6"  (1.676 m)   Wt 275 lb (124.7 kg)   LMP 12/12/2016   SpO2 94%   BMI 44.39 kg/m   Physical Exam  Constitutional: She is oriented to person, place, and time.  Obese, no acute distress  HENT:  Head: Normocephalic and atraumatic.  Eyes: EOM are normal. Pupils are equal, round, and reactive to light.  Horizontal nystagmus noted, pupils 7 mm reactive bilaterally  Neck: Normal range of  motion. Neck supple.  No meningismus  Cardiovascular: Normal rate, regular rhythm and normal heart sounds.   Pulmonary/Chest: Effort normal and breath sounds normal. No respiratory distress. She has no wheezes.  Abdominal: Soft. Bowel sounds are normal. There is no tenderness.  Neurological: She is alert and oriented to person, place, and time.  Cranial nerves II through XII intact, 5 out of 5 strength in all 4 extremity, no dysmetria to finger-nose-finger, fluid speech  Skin: Skin is warm and dry.  Psychiatric: She has a normal mood and affect.  Nursing note and vitals reviewed.    ED Treatments / Results  Labs (all labs ordered are listed, but only abnormal results are displayed) Labs Reviewed  BASIC METABOLIC PANEL - Abnormal; Notable for the following:       Result Value   Glucose, Bld 132 (*)    All other components within normal limits  CBC - Abnormal; Notable for the following:    WBC 14.7 (*)    All other components within normal limits  I-STAT TROPOININ, ED  I-STAT BETA HCG BLOOD, ED ([facility], [facility], [facility] ONLY)    EKG  EKG Interpretation  Date/Time:  Wednesday Feb 11 2017 02:47:24 EDT Ventricular Rate:  61 PR Interval:  152 QRS Duration: 98 QT Interval:  394 QTC Calculation: 396 R Axis:   75 Text Interpretation:  Normal sinus rhythm Nonspecific T wave abnormality Abnormal ECG Confirmed by Ross MarcusHorton, Turquoise Esch ([id number]) on 02/11/2017 4:03:05 AM       Radiology Dg Chest 2 View  Result Date: 02/11/2017 CLINICAL DATA:  Chest pain chest radiograph 02/11/2013 EXAM: CHEST  2 VIEW COMPARISON:  None. FINDINGS: The heart size and mediastinal contours are unchanged, again mildly enlarged. Both lungs are clear. The visualized skeletal structures are unremarkable. IMPRESSION: Unchanged mild cardiomegaly without active cardiopulmonary disease. Electronically Signed   By: Deatra RobinsonKevin  Herman M.D.   On: 02/11/2017 03:12    Procedures Procedures (including critical care time)  Medications  Ordered in ED Medications  sodium chloride 0.9 % bolus 1,000 mL (1,000 mLs Intravenous New Bag/Given 02/11/17 0450)  prochlorperazine (COMPAZINE) injection 10 mg (10 mg Intravenous Given 02/11/17 0451)  diphenhydrAMINE (BENADRYL) injection 25 mg (25 mg Intravenous Given 02/11/17 0450)  ketorolac (TORADOL) 30 MG/ML injection 30 mg (30 mg Intravenous Given 02/11/17 0450)     Initial Impression / Assessment and Plan / ED Course  I have reviewed the triage vital signs and the nursing notes.  Pertinent labs & imaging results that were available during my care of the patient were reviewed by me and considered in my medical decision making (see chart for details).  Clinical Course as of Feb 12 507  Wed Feb 11, 2017  0506 I was called to the patient's room as she is requesting discharge. She states she is very anxious and "I can't stay in this room." I  have asked her if there is anything additional help her with to make her feel uncomfortable. She states "I just need to go home." When asked her headache is any better, patient states "I guess so." She is awake, alert, and oriented. She appears to have capacity.  [CH]    Clinical Course User Index [CH] Briannie Gutierrez, Mayer Masker, MD    Patient presents with multiple complaints. Overall nontoxic-appearing. Nonfocal. Vital signs reassuring with the exception of mild hypertension. She is neurologically intact. Patient given migraine cocktail. Urine beta pending. No signs or symptoms of meningitis or subarachnoid hemorrhage at this time. Features of her headache are consistent with tension headache; however, migraine is also a potential. Prior to full evaluation and treatment, patient requested discharge home. She appears to have capacity. I offered to help her get more comfortable but she insisted that she had to go home. She was encouraged return if she has any worsening symptoms.  After history, exam, and medical workup I feel the patient has been appropriately  medically screened and is safe for discharge home. Pertinent diagnoses were discussed with the patient. Patient was given return precautions.   Final Clinical Impressions(s) / ED Diagnoses   Final diagnoses:  Acute non intractable tension-type headache  Dizziness    New Prescriptions New Prescriptions   No medications on file     Shon Baton, MD 02/11/17 [phone]

## 2017-02-11 NOTE — ED Triage Notes (Addendum)
Pt presents with multiple complaints that all started "about 2 days ago"; pt states she was sitting on her bed when she suddenly got dizzy, got a headache, then she began to see spots in her field of vision in the left eye, then she got CP and SOB, and numbness in her fingers and now she feels like shes going to faint; pt denies LOC; pt then went on to complain that "[facility] did not call me with important results regarding my health"

## 2017-02-11 NOTE — ED Notes (Signed)
Patient very anxious states she wants to go home can't lay down here. Dr.Horton at bedside and talked with patient . IV d/c'd. Patient refused to wait for discharge paper work.

## 2017-02-11 NOTE — Discharge Instructions (Signed)
You were seen today for headache and dizziness. Workup is largely reassuring. You requested discharge prior to full treatment. If you have any new or worsening symptoms she should be reevaluated immediately.

## 2017-07-21 ENCOUNTER — Ambulatory Visit: Payer: Self-pay | Admitting: Physician Assistant

## 2018-01-15 IMAGING — DX DG CHEST 2V
2 series · 2 of 2 positions shown · non-contrast
Comparison: None.

CLINICAL DATA: Chest pain chest radiograph 02/11/2013

EXAM:
CHEST  2 VIEW

[chest pa]
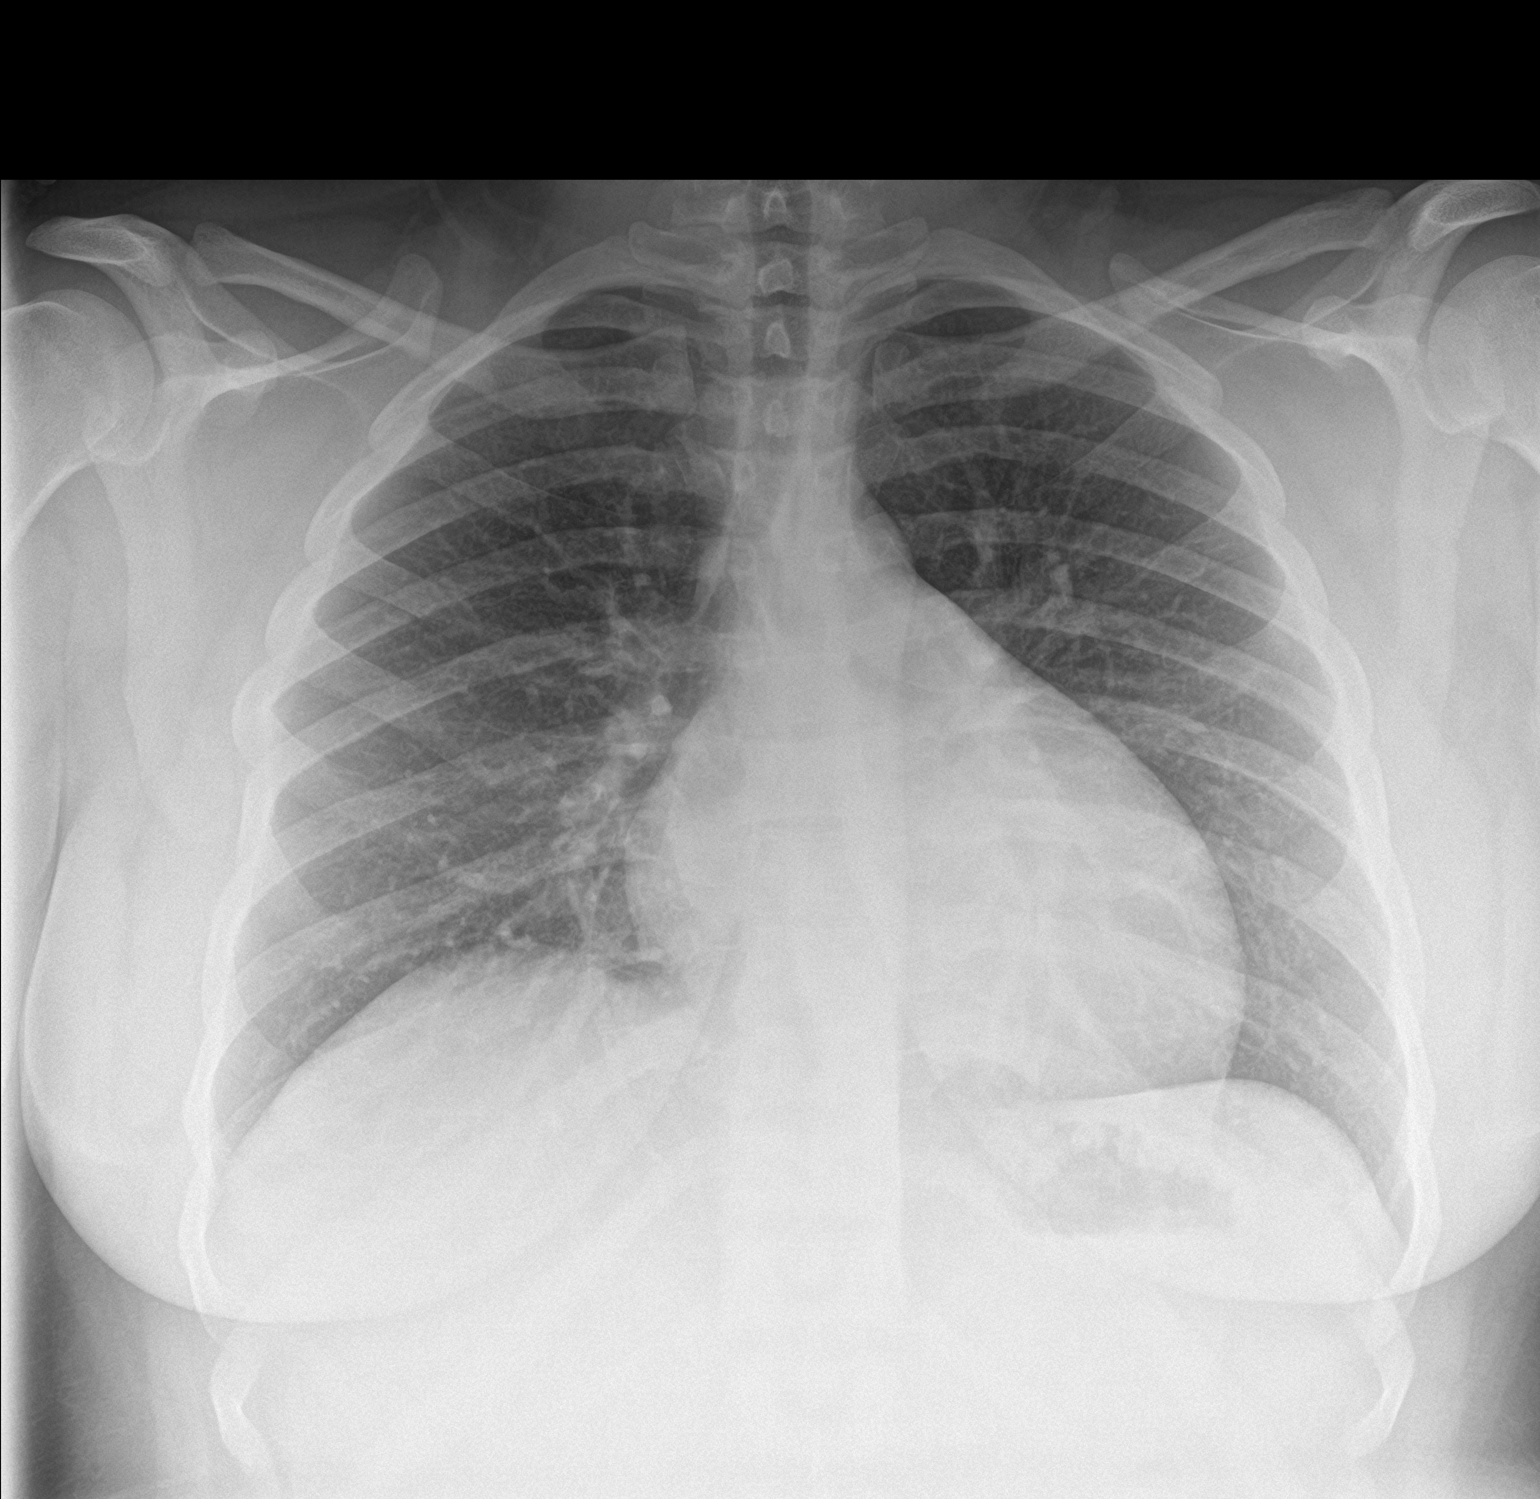

[chest lat]
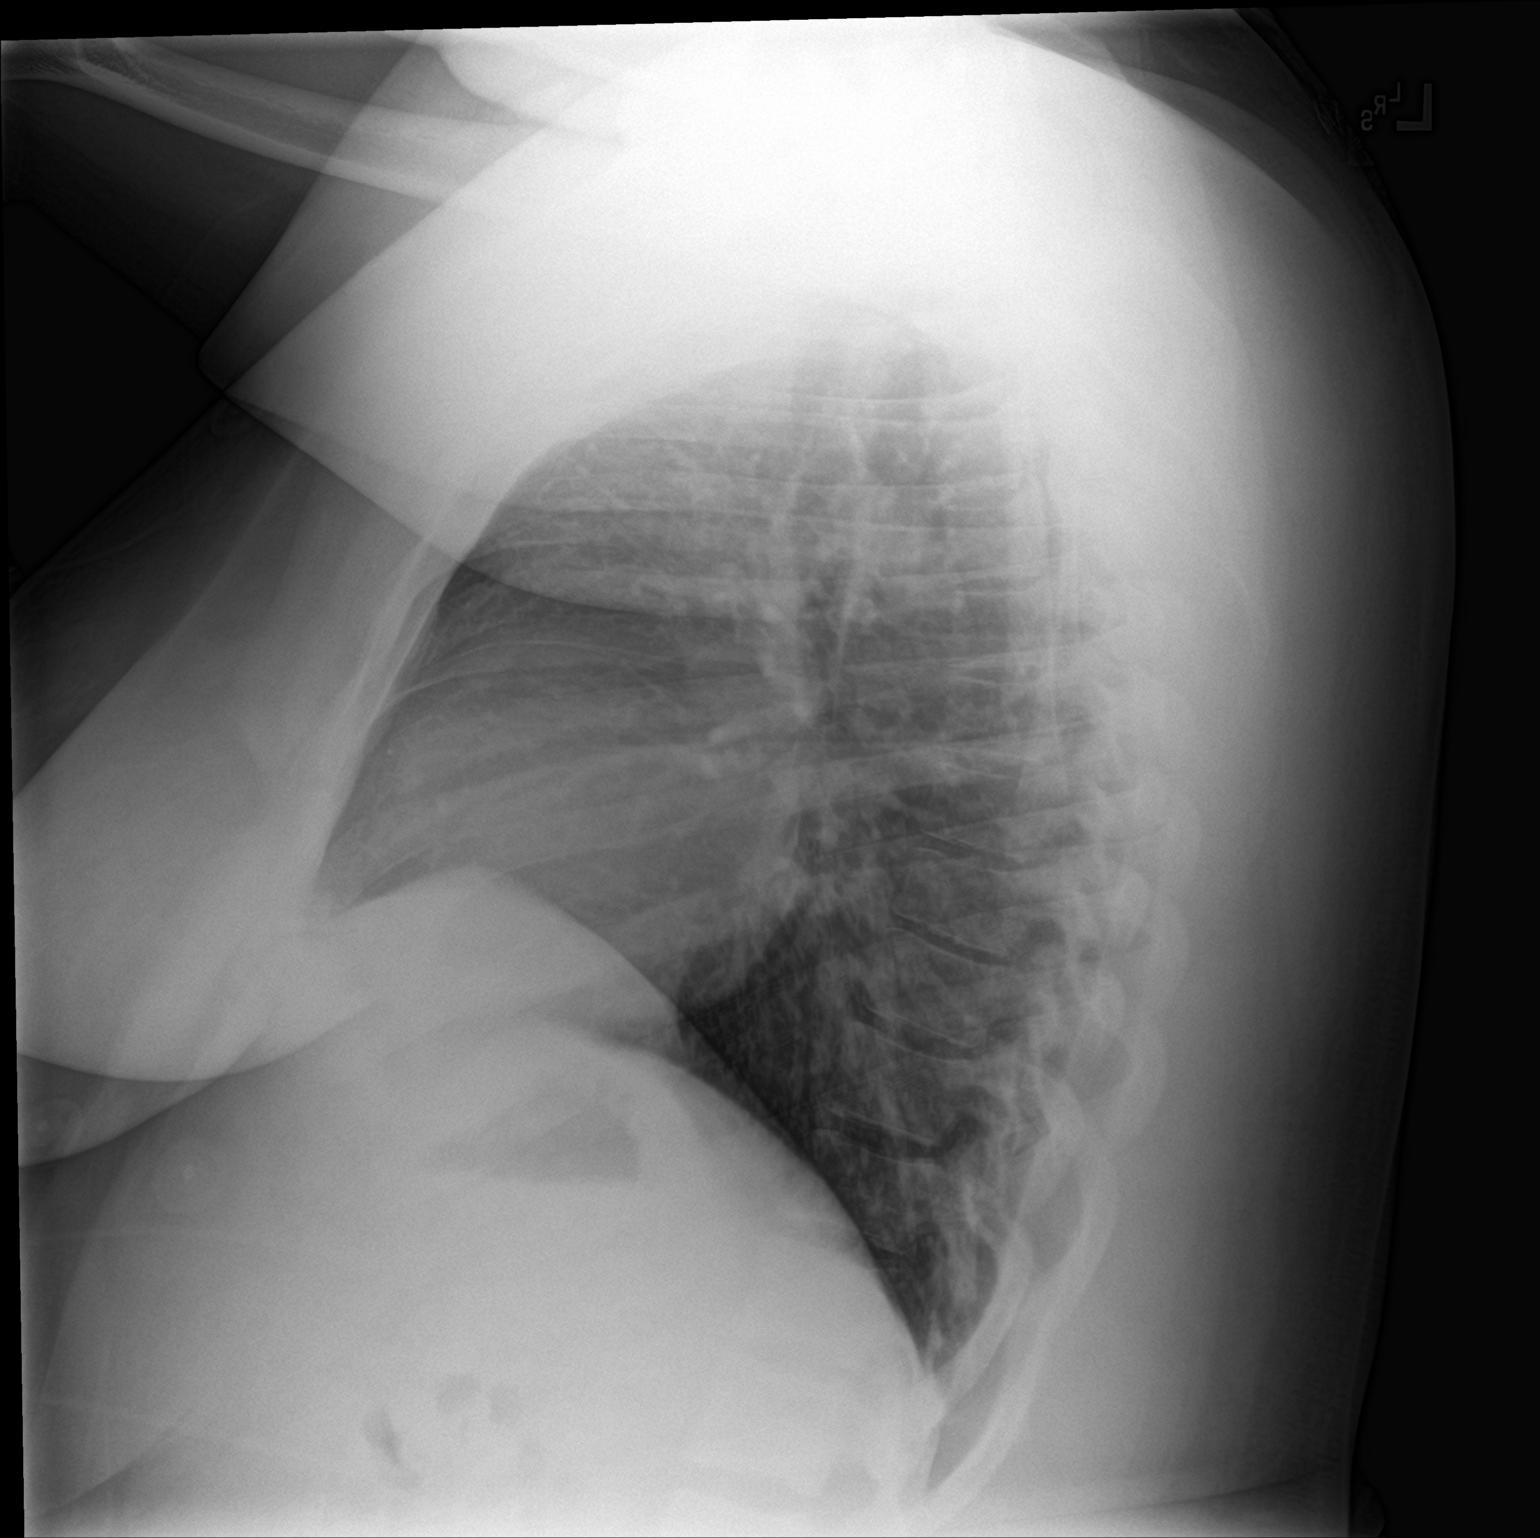

[2 of 2 positions shown; findings below may reference images not displayed]

FINDINGS: The heart size and mediastinal contours are unchanged, again mildly
enlarged. Both lungs are clear. The visualized skeletal structures
are unremarkable.
IMPRESSION: Unchanged mild cardiomegaly without active cardiopulmonary disease.

## 2018-03-24 ENCOUNTER — Ambulatory Visit (HOSPITAL_COMMUNITY)
Admission: EM | Admit: 2018-03-24 | Discharge: 2018-03-24 | Disposition: A | Discharge status: Home / Self Care | Payer: Medicaid Other | Attending: Family Medicine | Admitting: Family Medicine

## 2018-03-24 ENCOUNTER — Encounter (HOSPITAL_COMMUNITY): Payer: Self-pay

## 2018-03-24 DIAGNOSIS — N898 Other specified noninflammatory disorders of vagina: Secondary | ICD-10-CM | POA: Diagnosis present

## 2018-03-24 DIAGNOSIS — L98491 Non-pressure chronic ulcer of skin of other sites limited to breakdown of skin: Secondary | ICD-10-CM

## 2018-03-24 DIAGNOSIS — Z832 Family history of diseases of the blood and blood-forming organs and certain disorders involving the immune mechanism: Secondary | ICD-10-CM | POA: Insufficient documentation

## 2018-03-24 DIAGNOSIS — Z87891 Personal history of nicotine dependence: Secondary | ICD-10-CM | POA: Insufficient documentation

## 2018-03-24 DIAGNOSIS — B373 Candidiasis of vulva and vagina: Secondary | ICD-10-CM | POA: Insufficient documentation

## 2018-03-24 DIAGNOSIS — B3731 Acute candidiasis of vulva and vagina: Secondary | ICD-10-CM

## 2018-03-24 LAB — POCT URINALYSIS DIP (DEVICE)
Bilirubin Urine: NEGATIVE
Glucose, UA: NEGATIVE mg/dL
HGB URINE DIPSTICK: NEGATIVE
Ketones, ur: NEGATIVE mg/dL
LEUKOCYTES UA: NEGATIVE
NITRITE: NEGATIVE
PROTEIN: NEGATIVE mg/dL
Specific Gravity, Urine: 1.025 (ref 1.005–1.030)
UROBILINOGEN UA: 0.2 mg/dL (ref 0.0–1.0)
pH: 6 (ref 5.0–8.0)

## 2018-03-24 LAB — POCT PREGNANCY, URINE: Preg Test, Ur: NEGATIVE

## 2018-03-24 MED ORDER — VALACYCLOVIR HCL 1 G PO TABS: 1000.0000 mg | ORAL_TABLET | Freq: Three times a day (TID) | ORAL | 0 refills | Status: AC

## 2018-03-24 MED ORDER — AZITHROMYCIN 250 MG PO TABS
ORAL_TABLET | ORAL | Status: AC
  Filled 2018-03-24: qty 4

## 2018-03-24 MED ORDER — FLUCONAZOLE 150 MG PO TABS: 150.0000 mg | ORAL_TABLET | Freq: Once | ORAL | 2 refills | Status: AC

## 2018-03-24 MED ORDER — AZITHROMYCIN 250 MG PO TABS
1000.0000 mg | ORAL_TABLET | Freq: Once | ORAL | Status: AC
  Administered 2018-03-24: 1000 mg via ORAL

## 2018-03-24 NOTE — Discharge Instructions (Addendum)
These ulcers could be a yeast infection but are more likely a form of herpes  The culture for herpes is pending  We are treating you for herpes, yeast and chlamydia.

## 2018-03-24 NOTE — ED Triage Notes (Signed)
Pt complains of bumps on perineum x 3 days and white vaginal discharge.

## 2018-03-24 NOTE — ED Provider Notes (Signed)
[facility]   [id number] 03/24/18 Arrival Time: 1557   SUBJECTIVE:  Sherry Duke is a 27 y.o. female who presents to the urgent care with complaint of bumps on perineum x 3 days and white vaginal discharge.  Patient experiences burning on skin when she voids.  Patient recently engaged in oral sex and used condom for intercourse.    No h/o STD  Some lower abdominal cramping lately  Past Medical History:  Diagnosis Date  . Irregular menstrual cycle   . Prediabetes    Family History  Adopted: Yes  Problem Relation Age of Onset  . Sickle cell anemia Sister    Social History   Socioeconomic History  . Marital status: Single    Spouse name: Not on file  . Number of children: Not on file  . Years of education: Not on file  . Highest education level: Not on file  Occupational History  . Not on file  Social Needs  . Financial resource strain: Not on file  . Food insecurity:    Worry: Not on file    Inability: Not on file  . Transportation needs:    Medical: Not on file    Non-medical: Not on file  Tobacco Use  . Smoking status: Former Games developermoker  . Smokeless tobacco: Never Used  . Tobacco comment: Marijuana  Substance and Sexual Activity  . Alcohol use: No    Alcohol/week: 0.0 oz  . Drug use: Yes    Frequency: 7.0 times per week    Types: Marijuana  . Sexual activity: Yes    Partners: Male    Birth control/protection: None  Lifestyle  . Physical activity:    Days per week: Not on file    Minutes per session: Not on file  . Stress: Not on file  Relationships  . Social connections:    Talks on phone: Not on file    Gets together: Not on file    Attends religious service: Not on file    Active member of club or organization: Not on file    Attends meetings of clubs or organizations: Not on file    Relationship status: Not on file  . Intimate partner violence:    Fear of current or ex partner: Not on file    Emotionally abused: Not on file   Physically abused: Not on file    Forced sexual activity: Not on file  Other Topics Concern  . Not on file  Social History Narrative  . Not on file   No outpatient medications have been marked as taking for the 03/24/18 encounter [facility](Hospital Encounter).   No Known Allergies    ROS: As per HPI, remainder of ROS negative.   OBJECTIVE:   Vitals:   03/24/18 1631  BP: (!) 165/93  Pulse: 64  Resp: 18  Temp: 98 F (36.7 C)  SpO2: 100%     General appearance: alert; no distress Eyes: PERRL; EOMI; conjunctiva normal HENT: normocephalic; atraumatic; oral mucosa normal Neck: supple Abdomen: soft, non-tender; bowel sounds normal; no masses or organomegaly; no guarding or rebound tenderness Perineum:  Multiple 3 mm ulcerations in midline between vagina and rectum Back: no CVA tenderness Extremities: no cyanosis or edema; symmetrical with no gross deformities Skin: warm and dry Neurologic: normal gait; grossly normal Psychological: alert and cooperative; normal mood and affect      Labs:  Results for orders placed or performed during the hospital encounter of 03/24/18  POCT urinalysis dip (device)  Result Value Ref Range   Glucose, UA NEGATIVE NEGATIVE mg/dL   Bilirubin Urine NEGATIVE NEGATIVE   Ketones, ur NEGATIVE NEGATIVE mg/dL   Specific Gravity, Urine 1.025 1.005 - 1.030   Hgb urine dipstick NEGATIVE NEGATIVE   pH 6.0 5.0 - 8.0   Protein, ur NEGATIVE NEGATIVE mg/dL   Urobilinogen, UA 0.2 0.0 - 1.0 mg/dL   Nitrite NEGATIVE NEGATIVE   Leukocytes, UA NEGATIVE NEGATIVE  Pregnancy, urine POC  Result Value Ref Range   Preg Test, Ur NEGATIVE NEGATIVE    Labs Reviewed  HSV CULTURE AND TYPING  POCT URINALYSIS DIP (DEVICE)  POCT PREGNANCY, URINE  URINE CYTOLOGY ANCILLARY ONLY    No results found.     ASSESSMENT & PLAN:  1. Vaginal discharge   2. Skin ulcer of perineum, limited to breakdown of skin (HCC)   3. Candida vaginitis     Meds ordered this  encounter  Medications  . azithromycin (ZITHROMAX) tablet 1,000 mg  . fluconazole (DIFLUCAN) 150 MG tablet    Sig: Take 1 tablet (150 mg total) by mouth once for 1 dose.    Dispense:  1 tablet    Refill:  2  . valACYclovir (VALTREX) 1000 MG tablet    Sig: Take 1 tablet (1,000 mg total) by mouth 3 (three) times daily for 7 days.    Dispense:  21 tablet    Refill:  0  These ulcers could be a yeast infection but are more likely a form of herpes  The culture for herpes is pending  We are treating you for herpes, yeast and chlamydia.  Reviewed expectations re: course of current medical issues. Questions answered. Outlined signs and symptoms indicating need for more acute intervention. Patient verbalized understanding. After Visit Summary given.       Elvina Sidle, MD 03/24/18 1745

## 2018-03-25 LAB — URINE CYTOLOGY ANCILLARY ONLY
Chlamydia: NEGATIVE
Neisseria Gonorrhea: NEGATIVE
Trichomonas: NEGATIVE

## 2018-03-26 LAB — URINE CYTOLOGY ANCILLARY ONLY
Bacterial vaginitis: POSITIVE — AB
Candida vaginitis: NEGATIVE

## 2018-03-26 LAB — HSV CULTURE AND TYPING

## 2018-03-29 ENCOUNTER — Telehealth (HOSPITAL_COMMUNITY): Payer: Self-pay

## 2018-03-29 NOTE — Telephone Encounter (Signed)
Bacterial Vaginosis test is positive.  Pt was given Azithromycin at visit which can treat this per Dr. Cala BradfordLauentstein, pt denies any symptoms. Pt contacted regarding results. Answered all questions. Verbalized understanding.  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 Educated on Herpes and safe sex practices. Pt denies any concerns and verbalized understanding.

## 2018-04-25 ENCOUNTER — Ambulatory Visit (HOSPITAL_COMMUNITY)
Admission: EM | Admit: 2018-04-25 | Discharge: 2018-04-25 | Disposition: A | Discharge status: Home / Self Care | Payer: Medicaid Other | Attending: Internal Medicine | Admitting: Internal Medicine

## 2018-04-25 ENCOUNTER — Encounter (HOSPITAL_COMMUNITY): Payer: Self-pay | Admitting: Emergency Medicine

## 2018-04-25 ENCOUNTER — Other Ambulatory Visit: Payer: Self-pay

## 2018-04-25 DIAGNOSIS — N926 Irregular menstruation, unspecified: Secondary | ICD-10-CM

## 2018-04-25 LAB — POCT URINALYSIS DIP (DEVICE)
BILIRUBIN URINE: NEGATIVE
GLUCOSE, UA: NEGATIVE mg/dL
HGB URINE DIPSTICK: NEGATIVE
Ketones, ur: NEGATIVE mg/dL
LEUKOCYTES UA: NEGATIVE
Nitrite: NEGATIVE
Protein, ur: NEGATIVE mg/dL
SPECIFIC GRAVITY, URINE: 1.025 (ref 1.005–1.030)
Urobilinogen, UA: 0.2 mg/dL (ref 0.0–1.0)
pH: 6 (ref 5.0–8.0)

## 2018-04-25 LAB — POCT PREGNANCY, URINE: Preg Test, Ur: NEGATIVE

## 2018-04-25 NOTE — Discharge Instructions (Signed)
Pregnancy test negative, Urine normal  Please follow up if cycle continuing to be irregular with your OBGYN  Follow up if developing pain, discharge, nausea, vomiting, abnormal bleeding

## 2018-04-25 NOTE — ED Provider Notes (Signed)
[facility]    CSN: [id number] Arrival date & time: 04/25/18  1210     History   Chief Complaint Chief Complaint  Patient presents with  . Urinary Tract Infection    HPI Sherry Duke is a 27 y.o. female.   History of PCOS presenting today for evaluation of irregular cycle.  Patient states that for most of her life she has had irregular menstrual cycles, this year she has been having cycles regularly every month.  This month she had some mild spotting, but not a full period.  She is concerned about pregnancy.  She denies any dysuria, urgency, but does note to have some increased frequency.  She has had some occasional abdominal cramping.  Denies any nausea or vomiting.  Denies fevers.  Denies any pelvic pain or abnormal discharge.     Past Medical History:  Diagnosis Date  . Irregular menstrual cycle   . Prediabetes     Patient Active Problem List   Diagnosis Date Noted  . Obesity 09/05/2014  . Low grade squamous intraepithelial lesion (LGSIL) on Papanicolaou smear of cervix 09/04/2014  . BV (bacterial vaginosis) 09/04/2014  . Papanicolaou smear of cervix with low grade squamous intraepithelial lesion (LGSIL) 12/08/2013  . Pap smear of cervix with ASCUS, cannot exclude HGSIL 03/21/2013  . PCOS (polycystic ovarian syndrome) 03/21/2013  . Abnormal uterine bleeding 03/21/2013    Past Surgical History:  Procedure Laterality Date  . KNEE ARTHROSCOPY W/ ACL RECONSTRUCTION Left 2016  . NO PAST SURGERIES      OB History    Gravida  0   Para  0   Term  0   Preterm  0   AB  0   Living  0     SAB  0   TAB  0   Ectopic  0   Multiple  0   Live Births               Home Medications    Prior to Admission medications   Not on File    Family History Family History  Adopted: Yes  Problem Relation Age of Onset  . Sickle cell anemia Sister     Social History Social History   Tobacco Use  . Smoking status: Former Games developer  .  Smokeless tobacco: Never Used  . Tobacco comment: Marijuana  Substance Use Topics  . Alcohol use: No    Alcohol/week: 0.0 oz  . Drug use: Yes    Frequency: 7.0 times per week    Types: Marijuana     Allergies   Patient has no known allergies.   Review of Systems Review of Systems  Constitutional: Negative for fever.  Respiratory: Negative for shortness of breath.   Cardiovascular: Negative for chest pain.  Gastrointestinal: Negative for abdominal pain, diarrhea, nausea and vomiting.  Genitourinary: Positive for frequency and menstrual problem. Negative for dysuria, flank pain, genital sores, hematuria, vaginal bleeding, vaginal discharge and vaginal pain.  Musculoskeletal: Negative for back pain.  Skin: Negative for rash.  Neurological: Negative for dizziness, light-headedness and headaches.     Physical Exam Triage Vital Signs ED Triage Vitals  Enc Vitals Group     BP 04/25/18 1320 139/84     Pulse Rate 04/25/18 1320 66     Resp 04/25/18 1320 18     Temp 04/25/18 1320 98.4 F (36.9 C)     Temp Source 04/25/18 1320 Oral     SpO2 04/25/18 1320 97 %  Weight --      Height --      Head Circumference --      Peak Flow --      Pain Score 04/25/18 1318 5     Pain Loc --      Pain Edu? --      Excl. in GC? --    No data found.  Updated Vital Signs BP 139/84 (BP Location: Right Arm)   Pulse 66   Temp 98.4 F (36.9 C) (Oral)   Resp 18   LMP 03/09/2018   SpO2 97%   Visual Acuity Right Eye Distance:   Left Eye Distance:   Bilateral Distance:    Right Eye Near:   Left Eye Near:    Bilateral Near:     Physical Exam  Constitutional: She is oriented to person, place, and time. She appears well-developed and well-nourished.  No acute distress  HENT:  Head: Normocephalic and atraumatic.  Nose: Nose normal.  Eyes: Conjunctivae are normal.  Neck: Neck supple.  Cardiovascular: Normal rate.  Pulmonary/Chest: Effort normal. No respiratory distress.    Abdominal: She exhibits no distension.  Nontender to light and deep palpation throughout all 4 quadrants  Musculoskeletal: Normal range of motion.  Neurological: She is alert and oriented to person, place, and time.  Skin: Skin is warm and dry.  Psychiatric: She has a normal mood and affect.  Nursing note and vitals reviewed.    UC Treatments / Results  Labs (all labs ordered are listed, but only abnormal results are displayed) Labs Reviewed  POCT URINALYSIS DIP (DEVICE)  POCT PREGNANCY, URINE    EKG None  Radiology No results found.  Procedures Procedures (including critical care time)  Medications Ordered in UC Medications - No data to display  Initial Impression / Assessment and Plan / UC Course  I have reviewed the triage vital signs and the nursing notes.  Pertinent labs & imaging results that were available during my care of the patient were reviewed by me and considered in my medical decision making (see chart for details).     Pregnancy test negative, UA unremarkable.  Likely irregular cycle, given patient's history of PCOS.  Recommending to follow-up with OB/GYN for further evaluation, but may monitor to see if cycle returns back to normal next month.  Is not having any abdominal tenderness.  No other symptoms.Discussed strict return precautions. Patient verbalized understanding and is agreeable with plan.  Final Clinical Impressions(s) / UC Diagnoses   Final diagnoses:  Irregular menstruation     Discharge Instructions     Pregnancy test negative, Urine normal  Please follow up if cycle continuing to be irregular with your OBGYN  Follow up if developing pain, discharge, nausea, vomiting, abnormal bleeding   ED Prescriptions    None     Controlled Substance Prescriptions [place] Controlled Substance Registry consulted? Not Applicable   Lew DawesWieters, Hallie C, [place]PA-C 04/25/18 1403

## 2018-04-25 NOTE — ED Triage Notes (Signed)
Regular periods this year until this month patient just spotted.  Slight abdominal pain, no back pain.

## 2022-11-26 ENCOUNTER — Ambulatory Visit: Payer: Self-pay | Admitting: *Deleted

## 2022-11-26 NOTE — Telephone Encounter (Signed)
Answer Assessment - Initial Assessment Questions 1. LOCATION: "Where does it hurt?"      Enlarged liver concerns- patient is patient at [facility]  Patient is irate and I can not triage her- she is wanting to schedule a new patient appointment  Protocols used: Abdominal Pain - [facility]

## 2022-11-26 NOTE — Telephone Encounter (Signed)
  Chief Complaint: Abdominal pain- concerned about test results- enlarged liver Symptoms: abdominal pain, metallic taste in mouth  Disposition: []$ ED /[]$ Urgent Care (no appt availability in office) / []$ Appointment(In office/virtual)/ []$  [facility]/ []$ Home Care/ []$ Refused Recommended Disposition /[]$ [facility]/ []$  Follow-up with PCP Additional Notes: Patient is irate and cussing- she keeps putting me on hold and yelling. Patient advised she will need records and NP appointment- she wants an appointment as quick as she can get one. Agent helped schedule that for 12/02/22- she is satisfied with that- since I could not triage her- she was advised to be seen at UC/ED if she gets worse or has additional symptoms. She states she will. Patient informed information for NP appointment was sent to her email. Patient gives contact number as [phone]

## 2022-12-02 ENCOUNTER — Ambulatory Visit: Payer: Medicaid Other | Admitting: Family Medicine

## 2023-06-09 ENCOUNTER — Emergency Department (HOSPITAL_COMMUNITY)
Admission: EM | Admit: 2023-06-09 | Discharge: 2023-06-10 | Disposition: A | Discharge status: Home / Self Care | Payer: MEDICAID | Attending: Emergency Medicine | Admitting: Emergency Medicine

## 2023-06-09 DIAGNOSIS — R1012 Left upper quadrant pain: Secondary | ICD-10-CM | POA: Insufficient documentation

## 2023-06-09 DIAGNOSIS — R350 Frequency of micturition: Secondary | ICD-10-CM | POA: Insufficient documentation

## 2023-06-09 DIAGNOSIS — R0789 Other chest pain: Secondary | ICD-10-CM | POA: Diagnosis not present

## 2023-06-09 DIAGNOSIS — R1032 Left lower quadrant pain: Secondary | ICD-10-CM | POA: Insufficient documentation

## 2023-06-09 DIAGNOSIS — R109 Unspecified abdominal pain: Secondary | ICD-10-CM

## 2023-06-09 NOTE — ED Triage Notes (Signed)
Pt reports with chest pain and abdominal pain. Pt states that she always has chest pain. Pt reports that the whole left side including her back has been hurting for 1 day.

## 2023-06-10 ENCOUNTER — Other Ambulatory Visit: Payer: Self-pay

## 2023-06-10 ENCOUNTER — Encounter (HOSPITAL_COMMUNITY): Payer: Self-pay

## 2023-06-10 ENCOUNTER — Emergency Department (HOSPITAL_COMMUNITY): Payer: MEDICAID

## 2023-06-10 LAB — URINALYSIS, ROUTINE W REFLEX MICROSCOPIC
Bilirubin Urine: NEGATIVE
Glucose, UA: NEGATIVE mg/dL
Hgb urine dipstick: NEGATIVE
Ketones, ur: NEGATIVE mg/dL
Leukocytes,Ua: NEGATIVE
Nitrite: NEGATIVE
Protein, ur: NEGATIVE mg/dL
Specific Gravity, Urine: 1.025 (ref 1.005–1.030)
pH: 6 (ref 5.0–8.0)

## 2023-06-10 LAB — COMPREHENSIVE METABOLIC PANEL
ALT: 17 U/L (ref 0–44)
AST: 17 U/L (ref 15–41)
Albumin: 3.9 g/dL (ref 3.5–5.0)
Alkaline Phosphatase: 67 U/L (ref 38–126)
Anion gap: 9 (ref 5–15)
BUN: 13 mg/dL (ref 6–20)
CO2: 26 mmol/L (ref 22–32)
Calcium: 9.1 mg/dL (ref 8.9–10.3)
Chloride: 101 mmol/L (ref 98–111)
Creatinine, Ser: 0.85 mg/dL (ref 0.44–1.00)
GFR, Estimated: >60 mL/min (ref >60)
Glucose, Bld: 114 mg/dL — ABNORMAL HIGH (ref 70–99)
Potassium: 3.8 mmol/L (ref 3.5–5.1)
Sodium: 136 mmol/L (ref 135–145)
Total Bilirubin: 0.3 mg/dL (ref 0.3–1.2)
Total Protein: 7.6 g/dL (ref 6.5–8.1)

## 2023-06-10 LAB — CBC WITH DIFFERENTIAL/PLATELET
Abs Immature Granulocytes: 0.1 10*3/uL — ABNORMAL HIGH (ref 0.00–0.07)
Basophils Absolute: 0.1 10*3/uL (ref 0.0–0.1)
Basophils Relative: 0 %
Eosinophils Absolute: 0.4 10*3/uL (ref 0.0–0.5)
Eosinophils Relative: 3 %
HCT: 40.1 % (ref 36.0–46.0)
Hemoglobin: 12.4 g/dL (ref 12.0–15.0)
Immature Granulocytes: 1 %
Lymphocytes Relative: 38 %
Lymphs Abs: 5.2 10*3/uL — ABNORMAL HIGH (ref 0.7–4.0)
MCH: 25.9 pg — ABNORMAL LOW (ref 26.0–34.0)
MCHC: 30.9 g/dL (ref 30.0–36.0)
MCV: 83.7 fL (ref 80.0–100.0)
Monocytes Absolute: 0.9 10*3/uL (ref 0.1–1.0)
Monocytes Relative: 6 %
Neutro Abs: 7.2 10*3/uL (ref 1.7–7.7)
Neutrophils Relative %: 52 %
Platelets: 254 10*3/uL (ref 150–400)
RBC: 4.79 MIL/uL (ref 3.87–5.11)
RDW: 14.6 % (ref 11.5–15.5)
WBC: 13.8 10*3/uL — ABNORMAL HIGH (ref 4.0–10.5)
nRBC: 0 % (ref 0.0–0.2)

## 2023-06-10 LAB — HCG, SERUM, QUALITATIVE: Preg, Serum: NEGATIVE

## 2023-06-10 LAB — TROPONIN I (HIGH SENSITIVITY): Troponin I (High Sensitivity): 5 ng/L (ref <18)

## 2023-06-10 LAB — LIPASE, BLOOD: Lipase: 38 U/L (ref 11–51)

## 2023-06-10 MED ORDER — DICYCLOMINE HCL 20 MG PO TABS: 20.0000 mg | ORAL_TABLET | Freq: Three times a day (TID) | ORAL | 0 refills | Status: AC

## 2023-06-10 MED ORDER — SODIUM CHLORIDE (PF) 0.9 % IJ SOLN
INTRAMUSCULAR | Status: AC
  Filled 2023-06-10: qty 50

## 2023-06-10 MED ORDER — MORPHINE SULFATE (PF) 4 MG/ML IV SOLN
4.0000 mg | Freq: Once | INTRAVENOUS | Status: AC
  Administered 2023-06-10: 4 mg via INTRAVENOUS
  Filled 2023-06-10: qty 1

## 2023-06-10 MED ORDER — ONDANSETRON HCL 4 MG/2ML IJ SOLN
4.0000 mg | Freq: Once | INTRAMUSCULAR | Status: AC
  Administered 2023-06-10: 4 mg via INTRAVENOUS
  Filled 2023-06-10: qty 2

## 2023-06-10 MED ORDER — IOHEXOL 300 MG/ML  SOLN
100.0000 mL | Freq: Once | INTRAMUSCULAR | Status: AC | PRN
  Administered 2023-06-10: 100 mL via INTRAVENOUS

## 2023-06-10 NOTE — ED Provider Notes (Signed)
[place] EMERGENCY DEPARTMENT AT [facility] Provider Note   CSN: [id number] Arrival date & time: 06/09/23  2354     History  Chief Complaint  Patient presents with   Abdominal Pain   Chest Pain    Sherry Duke is a 32 y.o. female.  Patient presents to the emergency department for evaluation of flank and abdominal pain.  Symptoms present for 1 day.  Patient reports pain around to the left side but more in the left lower abdomen.  She has had some "loose stools, no actual diarrhea.  No constipation.  She has noticed some increased urination, no pain with urination.  There has not been vomiting.  Patient does report chest pain which is common for her.  It is now diffuse, entire chest.       Home Medications Prior to Admission medications   Medication Sig Start Date End Date Taking? Authorizing Provider  dicyclomine (BENTYL) 20 MG tablet Take 1 tablet (20 mg total) by mouth 3 (three) times daily before meals. 06/10/23  Yes Daltin Crist, Canary Brim, MD  OZEMPIC, 0.25 OR 0.5 MG/DOSE, 2 MG/3ML SOPN Inject 0.5 mg into the skin once a week.    [provider]      Allergies    Patient has no known allergies.    Review of Systems   Review of Systems  Physical Exam Updated Vital Signs BP (!) 160/93   Pulse (!) 53   Temp 98.4 F (36.9 C) (Oral)   Resp 16   Ht 5\' 6"  (1.676 m)   Wt 132.5 kg   SpO2 100%   BMI 47.13 kg/m  Physical Exam Vitals and nursing note reviewed.  Constitutional:      General: She is not in acute distress.    Appearance: She is well-developed.  HENT:     Head: Normocephalic and atraumatic.     Mouth/Throat:     Mouth: Mucous membranes are moist.  Eyes:     General: Vision grossly intact. Gaze aligned appropriately.     Extraocular Movements: Extraocular movements intact.     Conjunctiva/sclera: Conjunctivae normal.  Cardiovascular:     Rate and Rhythm: Normal rate and regular rhythm.     Pulses: Normal pulses.     Heart  sounds: Normal heart sounds, S1 normal and S2 normal. No murmur heard.    No friction rub. No gallop.  Pulmonary:     Effort: Pulmonary effort is normal. No respiratory distress.     Breath sounds: Normal breath sounds.  Chest:     Chest wall: Tenderness (Diffusely) present.  Abdominal:     General: Bowel sounds are normal.     Palpations: Abdomen is soft.     Tenderness: There is abdominal tenderness in the left upper quadrant and left lower quadrant. There is no guarding or rebound.     Hernia: No hernia is present.  Musculoskeletal:        General: No swelling.     Cervical back: Full passive range of motion without pain, normal range of motion and neck supple. No spinous process tenderness or muscular tenderness. Normal range of motion.     Right lower leg: No edema.     Left lower leg: No edema.  Skin:    General: Skin is warm and dry.     Capillary Refill: Capillary refill takes less than 2 seconds.     Findings: No ecchymosis, erythema, rash or wound.  Neurological:     General:  No focal deficit present.     Mental Status: She is alert and oriented to person, place, and time.     GCS: GCS eye subscore is 4. GCS verbal subscore is 5. GCS motor subscore is 6.     Cranial Nerves: Cranial nerves 2-12 are intact.     Sensory: Sensation is intact.     Motor: Motor function is intact.     Coordination: Coordination is intact.  Psychiatric:        Attention and Perception: Attention normal.        Mood and Affect: Mood normal.        Speech: Speech normal.        Behavior: Behavior normal.     ED Results / Procedures / Treatments   Labs (all labs ordered are listed, but only abnormal results are displayed) Labs Reviewed  COMPREHENSIVE METABOLIC PANEL - Abnormal; Notable for the following components:      Result Value   Glucose, Bld 114 (*)    All other components within normal limits  CBC WITH DIFFERENTIAL/PLATELET - Abnormal; Notable for the following components:   WBC  13.8 (*)    MCH 25.9 (*)    Lymphs Abs 5.2 (*)    Abs Immature Granulocytes 0.10 (*)    All other components within normal limits  LIPASE, BLOOD  URINALYSIS, ROUTINE W REFLEX MICROSCOPIC  HCG, SERUM, QUALITATIVE  TROPONIN I (HIGH SENSITIVITY)    EKG EKG Interpretation Date/Time:  Wednesday June 10 2023 00:11:50 EDT Ventricular Rate:  54 PR Interval:  184 QRS Duration:  112 QT Interval:  430 QTC Calculation: 408 R Axis:   70  Text Interpretation: Sinus rhythm Borderline intraventricular conduction delay Borderline T wave abnormalities No acute changes Confirmed by Gilda Crease [phone]) on 06/10/2023 12:14:01 AM  Radiology CT ABDOMEN PELVIS W CONTRAST  Result Date: 06/10/2023 CLINICAL DATA:  Abdominal pain. EXAM: CT ABDOMEN AND PELVIS WITH CONTRAST TECHNIQUE: Multidetector CT imaging of the abdomen and pelvis was performed using the standard protocol following bolus administration of intravenous contrast. RADIATION DOSE REDUCTION: This exam was performed according to the departmental dose-optimization program which includes automated exposure control, adjustment of the mA and/or kV according to patient size and/or use of iterative reconstruction technique. CONTRAST:  OMNIPAQUE IOHEXOL 300 MG/ML  SOLN COMPARISON:  None Available. FINDINGS: Lower chest: No acute abnormality. Hepatobiliary: No focal liver abnormality is seen. No gallstones, gallbladder wall thickening, or biliary dilatation. Pancreas: Unremarkable. No pancreatic ductal dilatation or surrounding inflammatory changes. Spleen: Normal in size without focal abnormality. Adrenals/Urinary Tract: Adrenal glands are unremarkable. Kidneys are normal, without renal calculi, focal lesion, or hydronephrosis. Bladder is unremarkable. Stomach/Bowel: Stomach is within normal limits. Appendix appears normal. No evidence of bowel wall thickening, distention, or inflammatory changes. Vascular/Lymphatic: Mild aortic  atherosclerosis. No enlarged abdominal or pelvic lymph nodes. Reproductive: Uterus and bilateral adnexa are unremarkable. Other: No abdominal wall hernia or abnormality. No abdominopelvic ascites. Musculoskeletal: No acute or significant osseous findings. IMPRESSION: 1. No acute or active process within the abdomen or pelvis. 2. Aortic atherosclerosis. Aortic Atherosclerosis (ICD10-I70.0). Electronically Signed   By: Aram Candela M.D.   On: 06/10/2023 03:33    Procedures Procedures    Medications Ordered in ED Medications  ondansetron (ZOFRAN) injection 4 mg (4 mg Intravenous Given 06/10/23 0249)  morphine (PF) 4 MG/ML injection 4 mg (4 mg Intravenous Given 06/10/23 0249)  iohexol (OMNIPAQUE) 300 MG/ML solution 100 mL (100 mLs Intravenous Contrast Given 06/10/23 0308)  ED Course/ Medical Decision Making/ A&P                                 Medical Decision Making Amount and/or Complexity of Data Reviewed Labs: ordered. Radiology: ordered.  Risk Prescription drug management.   Differential Diagnosis considered includes, but not limited to: Renal colic/kidney stone; pyelonephritis; aortic dissection; musculoskeletal pain; colitis; diverticulitis;   Patient presents with multiple complaints.  Patient here primarily complaining of left flank area pain.  Pain goes into the left lower abdomen and there is some mild tenderness.  Patient with leukocytosis, no other blood or urine abnormality.  Patient underwent CT scan of abdomen and pelvis, no acute pathology noted.  Patient complaining of chest pain as well.  This seems to be somewhat chronic for her.  EKG without acute changes.  Troponin negative.  This been ongoing for some time, does not need repeat troponin.        Final Clinical Impression(s) / ED Diagnoses Final diagnoses:  Abdominal pain, unspecified abdominal location    Rx / DC Orders ED Discharge Orders          Ordered    dicyclomine (BENTYL) 20 MG tablet  3  times daily before meals        06/10/23 0359              Gilda Crease, MD 06/10/23 [phone]

## 2023-11-30 ENCOUNTER — Encounter (HOSPITAL_BASED_OUTPATIENT_CLINIC_OR_DEPARTMENT_OTHER): Payer: Self-pay | Admitting: Emergency Medicine

## 2023-11-30 ENCOUNTER — Other Ambulatory Visit: Payer: Self-pay

## 2023-11-30 ENCOUNTER — Emergency Department (HOSPITAL_BASED_OUTPATIENT_CLINIC_OR_DEPARTMENT_OTHER)
Admission: EM | Admit: 2023-11-30 | Discharge: 2023-11-30 | Disposition: A | Discharge status: Home / Self Care | Payer: MEDICAID | Attending: Emergency Medicine | Admitting: Emergency Medicine

## 2023-11-30 DIAGNOSIS — R000 Tachycardia, unspecified: Secondary | ICD-10-CM | POA: Diagnosis present

## 2023-11-30 DIAGNOSIS — I493 Ventricular premature depolarization: Secondary | ICD-10-CM | POA: Insufficient documentation

## 2023-11-30 DIAGNOSIS — I491 Atrial premature depolarization: Secondary | ICD-10-CM | POA: Diagnosis not present

## 2023-11-30 DIAGNOSIS — Z79899 Other long term (current) drug therapy: Secondary | ICD-10-CM | POA: Diagnosis not present

## 2023-11-30 DIAGNOSIS — R002 Palpitations: Secondary | ICD-10-CM

## 2023-11-30 LAB — BASIC METABOLIC PANEL
Anion gap: 10 (ref 5–15)
BUN: 12 mg/dL (ref 6–20)
CO2: 25 mmol/L (ref 22–32)
Calcium: 8.8 mg/dL — ABNORMAL LOW (ref 8.9–10.3)
Chloride: 101 mmol/L (ref 98–111)
Creatinine, Ser: 1.44 mg/dL — ABNORMAL HIGH (ref 0.44–1.00)
GFR, Estimated: 49 mL/min — ABNORMAL LOW (ref >60)
Glucose, Bld: 144 mg/dL — ABNORMAL HIGH (ref 70–99)
Potassium: 3.6 mmol/L (ref 3.5–5.1)
Sodium: 136 mmol/L (ref 135–145)

## 2023-11-30 LAB — CBC
HCT: 39.6 % (ref 36.0–46.0)
Hemoglobin: 12.6 g/dL (ref 12.0–15.0)
MCH: 25.9 pg — ABNORMAL LOW (ref 26.0–34.0)
MCHC: 31.8 g/dL (ref 30.0–36.0)
MCV: 81.3 fL (ref 80.0–100.0)
Platelets: 197 10*3/uL (ref 150–400)
RBC: 4.87 MIL/uL (ref 3.87–5.11)
RDW: 14.2 % (ref 11.5–15.5)
WBC: 10.7 10*3/uL — ABNORMAL HIGH (ref 4.0–10.5)
nRBC: 0 % (ref 0.0–0.2)

## 2023-11-30 LAB — MAGNESIUM: Magnesium: 1.9 mg/dL (ref 1.7–2.4)

## 2023-11-30 MED ORDER — METOPROLOL TARTRATE 25 MG PO TABS: 25.0000 mg | ORAL_TABLET | Freq: Two times a day (BID) | ORAL | 1 refills | Status: AC

## 2023-11-30 NOTE — ED Triage Notes (Signed)
 Pt states chest fluttering and beating hard. X 2 weeks. Denies HX.

## 2023-11-30 NOTE — ED Provider Notes (Signed)
 [place] EMERGENCY DEPARTMENT AT [facility] Provider Note   CSN: [id number] Arrival date & time: 11/30/23  0436     History  Chief Complaint  Patient presents with   Tachycardia    Sherry Duke is a 33 y.o. female.  Presents to the emergency department for evaluation of heart palpitations.  Patient reports that for the last 2 weeks she has been feeling intermittent fluttering and irregular heartbeat.  She reports that she has had similar problems in the past, was referred to cardiology but never finished the workup.  She had significant social inhibit insulin for her at that time but now is more stable and can do the follow-up.       Home Medications Prior to Admission medications   Medication Sig Start Date End Date Taking? Authorizing Provider  metoprolol tartrate (LOPRESSOR) 25 MG tablet Take 1 tablet (25 mg total) by mouth 2 (two) times daily. 11/30/23  Yes Lyn Deemer, Canary Brim, MD  dicyclomine (BENTYL) 20 MG tablet Take 1 tablet (20 mg total) by mouth 3 (three) times daily before meals. 06/10/23   Jori Frerichs, Canary Brim, MD  OZEMPIC, 0.25 OR 0.5 MG/DOSE, 2 MG/3ML SOPN Inject 0.5 mg into the skin once a week.    [provider]      Allergies    Patient has no known allergies.    Review of Systems   Review of Systems  Physical Exam Updated Vital Signs BP 137/73 (BP Location: Right Arm)   Pulse (!) 51   Temp 98.5 F (36.9 C) (Oral)   Resp 17   Ht 5\' 6"  (1.676 m)   Wt 127 kg   LMP 11/16/2023 (Approximate)   SpO2 98%   BMI 45.19 kg/m  Physical Exam Vitals and nursing note reviewed.  Constitutional:      General: She is not in acute distress.    Appearance: She is well-developed.  HENT:     Head: Normocephalic and atraumatic.     Mouth/Throat:     Mouth: Mucous membranes are moist.  Eyes:     General: Vision grossly intact. Gaze aligned appropriately.     Extraocular Movements: Extraocular movements intact.      Conjunctiva/sclera: Conjunctivae normal.  Cardiovascular:     Rate and Rhythm: Normal rate and regular rhythm. Frequent Extrasystoles are present.    Pulses: Normal pulses.     Heart sounds: Normal heart sounds, S1 normal and S2 normal. No murmur heard.    No friction rub. No gallop.  Pulmonary:     Effort: Pulmonary effort is normal. No respiratory distress.     Breath sounds: Normal breath sounds.  Abdominal:     General: Bowel sounds are normal.     Palpations: Abdomen is soft.     Tenderness: There is no abdominal tenderness. There is no guarding or rebound.     Hernia: No hernia is present.  Musculoskeletal:        General: No swelling.     Cervical back: Full passive range of motion without pain, normal range of motion and neck supple. No spinous process tenderness or muscular tenderness. Normal range of motion.     Right lower leg: No edema.     Left lower leg: No edema.  Skin:    General: Skin is warm and dry.     Capillary Refill: Capillary refill takes less than 2 seconds.     Findings: No ecchymosis, erythema, rash or wound.  Neurological:  General: No focal deficit present.     Mental Status: She is alert and oriented to person, place, and time.     GCS: GCS eye subscore is 4. GCS verbal subscore is 5. GCS motor subscore is 6.     Cranial Nerves: Cranial nerves 2-12 are intact.     Sensory: Sensation is intact.     Motor: Motor function is intact.     Coordination: Coordination is intact.  Psychiatric:        Attention and Perception: Attention normal.        Mood and Affect: Mood normal.        Speech: Speech normal.        Behavior: Behavior normal.     ED Results / Procedures / Treatments   Labs (all labs ordered are listed, but only abnormal results are displayed) Labs Reviewed  CBC  BASIC METABOLIC PANEL  MAGNESIUM    EKG EKG Interpretation Date/Time:  Monday November 30 2023 04:46:39 EST Ventricular Rate:  57 PR Interval:  152 QRS  Duration:  102 QT Interval:  443 QTC Calculation: 432 R Axis:   73  Text Interpretation: Sinus rhythm Multiple premature complexes, vent & supraven Confirmed by Gilda Crease [phone]) on 11/30/2023 4:49:17 AM  Radiology No results found.  Procedures Procedures    Medications Ordered in ED Medications - No data to display  ED Course/ Medical Decision Making/ A&P                                 Medical Decision Making Amount and/or Complexity of Data Reviewed Labs: ordered.   Presents with palpitations.  Patient placed on a monitor and is having frequent PVCs and PACs that coincide with her symptoms.  Basic labs unremarkable, no evidence of significant electrolyte abnormality.  Would benefit from cardiology follow-up, will empirically prescribe Lopressor.        Final Clinical Impression(s) / ED Diagnoses Final diagnoses:  PAC (premature atrial contraction)  PVC (premature ventricular contraction)  Palpitations    Rx / DC Orders ED Discharge Orders          Ordered    Ambulatory referral to Cardiology       Comments: If you have not heard from the Cardiology office within the next 72 hours please call [phone].   11/30/23 0455    metoprolol tartrate (LOPRESSOR) 25 MG tablet  2 times daily        11/30/23 0456              Gilda Crease, MD 11/30/23 [phone]

## 2024-01-03 NOTE — Progress Notes (Deleted)
 Cardiology Office Note:    Date:  01/03/2024   ID:  Sherry H Viles, DOB 1991-05-20, MRN [id number]  PCP:  Brock Bad, MD   [facility] Providers Cardiologist:  None { Click to update primary MD,subspecialty MD or APP then REFRESH:1}    Referring MD: Gilda Crease,*   No chief complaint on file. ***  History of Present Illness:    Sherry Duke is a 33 y.o. female seen at the request of Dr Blinda Leatherwood for evaluation of palpitations.  Past Medical History:  Diagnosis Date   Irregular menstrual cycle    Prediabetes     Past Surgical History:  Procedure Laterality Date   KNEE ARTHROSCOPY W/ ACL RECONSTRUCTION Left 2016   NO PAST SURGERIES      Current Medications: No outpatient medications have been marked as taking for the 01/08/24 encounter (Appointment) with [place], Garold Sheeler M, MD.     Allergies:   Patient has no known allergies.   Social History   Socioeconomic History   Marital status: Single    Spouse name: Not on file   Number of children: Not on file   Years of education: Not on file   Highest education level: Not on file  Occupational History   Not on file  Tobacco Use   Smoking status: Former   Smokeless tobacco: Never   Tobacco comments:    Marijuana  Substance and Sexual Activity   Alcohol use: No    Alcohol/week: 0.0 standard drinks of alcohol   Drug use: Yes    Frequency: 7.0 times per week    Types: Marijuana   Sexual activity: Yes    Partners: Male    Birth control/protection: None  Other Topics Concern   Not on file  Social History Narrative   Not on file   Social Drivers of Health   Financial Resource Strain: Not on file  Food Insecurity: Not on file  Transportation Needs: Not on file  Physical Activity: Not on file  Stress: Not on file  Social Connections: Not on file     Family History: The patient's ***family history includes Sickle cell anemia in her sister. She was adopted.  ROS:   Please see  the history of present illness.    *** All other systems reviewed and are negative.  EKGs/Labs/Other Studies Reviewed:    The following studies were reviewed today: ***      Recent Labs: 06/10/2023: ALT 17 11/30/2023: BUN 12; Creatinine, Ser 1.44; Hemoglobin 12.6; Magnesium 1.9; Platelets 197; Potassium 3.6; Sodium 136  Recent Lipid Panel    Component Value Date/Time   CHOL 196 09/01/2012 1305   TRIG 125 09/01/2012 1305   HDL 40 09/01/2012 1305   CHOLHDL 4.9 09/01/2012 1305   VLDL 25 09/01/2012 1305   LDLCALC 131 (H) 09/01/2012 1305     Risk Assessment/Calculations:   {Does this patient have ATRIAL FIBRILLATION?:[phone]}  No BP recorded.  {Refresh Note OR Click here to enter BP  :1}***         Physical Exam:    VS:  There were no vitals taken for this visit.    Wt Readings from Last 3 Encounters:  11/30/23 280 lb (127 kg)  06/10/23 292 lb (132.5 kg)  02/11/17 275 lb (124.7 kg)     GEN: *** Well nourished, well developed in no acute distress HEENT: Normal NECK: No JVD; No carotid bruits LYMPHATICS: No lymphadenopathy CARDIAC: ***RRR, no murmurs, rubs, gallops RESPIRATORY:  Clear to  auscultation without rales, wheezing or rhonchi  ABDOMEN: Soft, non-tender, non-distended MUSCULOSKELETAL:  No edema; No deformity  SKIN: Warm and dry NEUROLOGIC:  Alert and oriented x 3 PSYCHIATRIC:  Normal affect   ASSESSMENT:    No diagnosis found. PLAN:    In order of problems listed above:  ***      {Are you ordering a CV Procedure (e.g. stress test, cath, DCCV, TEE, etc)?   Press F2        :[id number]}    Medication Adjustments/Labs and Tests Ordered: Current medicines are reviewed at length with the patient today.  Concerns regarding medicines are outlined above.  No orders of the defined types were placed in this encounter.  No orders of the defined types were placed in this encounter.   There are no Patient Instructions on file for this visit.    Signed, Drucella Karbowski [place], MD  01/03/2024 11:40 AM    [facility]

## 2024-01-08 ENCOUNTER — Ambulatory Visit: Payer: MEDICAID | Attending: Cardiology | Admitting: Cardiology

## 2024-01-11 ENCOUNTER — Encounter: Payer: Self-pay | Admitting: Cardiology
